# Patient Record
Sex: Male | Born: 1950 | Race: White | Hispanic: No | Marital: Married | Smoking: Former smoker
Health system: Southern US, Community
[De-identification: ages and names within clinical notes are randomized; demographics above are authoritative.]

## PROBLEM LIST (undated history)

## (undated) DIAGNOSIS — G459 Transient cerebral ischemic attack, unspecified: Secondary | ICD-10-CM

## (undated) DIAGNOSIS — E785 Hyperlipidemia, unspecified: Secondary | ICD-10-CM

## (undated) DIAGNOSIS — I10 Essential (primary) hypertension: Secondary | ICD-10-CM

## (undated) DIAGNOSIS — E291 Testicular hypofunction: Secondary | ICD-10-CM

## (undated) DIAGNOSIS — T7840XA Allergy, unspecified, initial encounter: Secondary | ICD-10-CM

## (undated) HISTORY — DX: Essential (primary) hypertension: I10

## (undated) HISTORY — DX: Hyperlipidemia, unspecified: E78.5

## (undated) HISTORY — DX: Transient cerebral ischemic attack, unspecified: G45.9

## (undated) HISTORY — DX: Testicular hypofunction: E29.1

## (undated) HISTORY — DX: Allergy, unspecified, initial encounter: T78.40XA

---

## 2004-12-28 ENCOUNTER — Ambulatory Visit: Payer: Self-pay | Admitting: Family Medicine

## 2005-01-04 ENCOUNTER — Ambulatory Visit: Payer: Self-pay | Admitting: Family Medicine

## 2005-01-12 ENCOUNTER — Ambulatory Visit: Payer: Self-pay | Admitting: Family Medicine

## 2005-01-30 ENCOUNTER — Ambulatory Visit: Payer: Self-pay | Admitting: Internal Medicine

## 2005-02-09 ENCOUNTER — Ambulatory Visit: Payer: Self-pay | Admitting: Internal Medicine

## 2005-02-13 ENCOUNTER — Ambulatory Visit: Payer: Self-pay | Admitting: Family Medicine

## 2005-03-14 ENCOUNTER — Ambulatory Visit: Payer: Self-pay | Admitting: Family Medicine

## 2005-05-29 ENCOUNTER — Ambulatory Visit: Payer: Self-pay | Admitting: Family Medicine

## 2005-08-30 ENCOUNTER — Ambulatory Visit: Payer: Self-pay | Admitting: Internal Medicine

## 2005-09-03 ENCOUNTER — Emergency Department (HOSPITAL_COMMUNITY): Admission: EM | Admit: 2005-09-03 | Discharge: 2005-09-03 | Payer: Self-pay | Admitting: Emergency Medicine

## 2005-09-03 ENCOUNTER — Emergency Department (HOSPITAL_COMMUNITY): Admission: EM | Admit: 2005-09-03 | Discharge: 2005-09-04 | Payer: Self-pay | Admitting: Emergency Medicine

## 2005-09-04 ENCOUNTER — Ambulatory Visit: Payer: Self-pay | Admitting: Family Medicine

## 2005-09-08 ENCOUNTER — Ambulatory Visit: Payer: Self-pay | Admitting: Family Medicine

## 2005-10-16 ENCOUNTER — Encounter: Payer: Self-pay | Admitting: Family Medicine

## 2005-10-16 HISTORY — PX: CHOLECYSTECTOMY: SHX55

## 2005-10-16 LAB — CONVERTED CEMR LAB

## 2005-10-24 ENCOUNTER — Ambulatory Visit: Payer: Self-pay | Admitting: Cardiology

## 2005-10-25 ENCOUNTER — Ambulatory Visit: Payer: Self-pay | Admitting: Internal Medicine

## 2005-10-25 ENCOUNTER — Emergency Department (HOSPITAL_COMMUNITY): Admission: EM | Admit: 2005-10-25 | Discharge: 2005-10-26 | Payer: Self-pay | Admitting: Emergency Medicine

## 2005-10-30 ENCOUNTER — Ambulatory Visit: Payer: Self-pay

## 2005-11-08 ENCOUNTER — Ambulatory Visit (HOSPITAL_COMMUNITY): Admission: RE | Admit: 2005-11-08 | Discharge: 2005-11-08 | Payer: Self-pay | Admitting: Family Medicine

## 2006-02-05 ENCOUNTER — Ambulatory Visit: Payer: Self-pay | Admitting: Family Medicine

## 2006-04-26 ENCOUNTER — Ambulatory Visit: Payer: Self-pay | Admitting: Family Medicine

## 2006-05-03 ENCOUNTER — Ambulatory Visit: Payer: Self-pay | Admitting: Family Medicine

## 2006-05-25 ENCOUNTER — Ambulatory Visit: Payer: Self-pay | Admitting: Internal Medicine

## 2006-05-29 ENCOUNTER — Encounter (INDEPENDENT_AMBULATORY_CARE_PROVIDER_SITE_OTHER): Payer: Self-pay | Admitting: *Deleted

## 2006-05-29 ENCOUNTER — Ambulatory Visit (HOSPITAL_COMMUNITY): Admission: RE | Admit: 2006-05-29 | Discharge: 2006-05-29 | Payer: Self-pay | Admitting: Internal Medicine

## 2006-05-31 ENCOUNTER — Ambulatory Visit: Payer: Self-pay | Admitting: Internal Medicine

## 2006-11-12 ENCOUNTER — Ambulatory Visit: Payer: Self-pay | Admitting: Family Medicine

## 2006-11-13 ENCOUNTER — Ambulatory Visit: Payer: Self-pay | Admitting: Internal Medicine

## 2006-11-13 ENCOUNTER — Inpatient Hospital Stay (HOSPITAL_COMMUNITY): Admission: AD | Admit: 2006-11-13 | Discharge: 2006-11-17 | Payer: Self-pay | Admitting: Internal Medicine

## 2006-11-13 ENCOUNTER — Emergency Department (HOSPITAL_COMMUNITY): Admission: EM | Admit: 2006-11-13 | Discharge: 2006-11-13 | Payer: Self-pay | Admitting: Emergency Medicine

## 2006-11-16 ENCOUNTER — Encounter (INDEPENDENT_AMBULATORY_CARE_PROVIDER_SITE_OTHER): Payer: Self-pay | Admitting: Specialist

## 2006-11-16 ENCOUNTER — Ambulatory Visit: Payer: Self-pay | Admitting: Internal Medicine

## 2007-06-21 ENCOUNTER — Encounter: Payer: Self-pay | Admitting: Family Medicine

## 2007-06-21 DIAGNOSIS — J309 Allergic rhinitis, unspecified: Secondary | ICD-10-CM

## 2007-06-21 DIAGNOSIS — L708 Other acne: Secondary | ICD-10-CM | POA: Insufficient documentation

## 2007-06-21 DIAGNOSIS — E781 Pure hyperglyceridemia: Secondary | ICD-10-CM

## 2007-06-26 ENCOUNTER — Ambulatory Visit: Payer: Self-pay | Admitting: Family Medicine

## 2007-06-26 DIAGNOSIS — F528 Other sexual dysfunction not due to a substance or known physiological condition: Secondary | ICD-10-CM | POA: Insufficient documentation

## 2007-08-20 ENCOUNTER — Ambulatory Visit: Payer: Self-pay | Admitting: Family Medicine

## 2008-08-25 ENCOUNTER — Ambulatory Visit: Payer: Self-pay | Admitting: Family Medicine

## 2008-09-22 ENCOUNTER — Ambulatory Visit: Payer: Self-pay | Admitting: Family Medicine

## 2008-09-22 LAB — CONVERTED CEMR LAB
ALT: 34 units/L (ref 0–53)
AST: 26 units/L (ref 0–37)
Albumin: 4.2 g/dL (ref 3.5–5.2)
Alkaline Phosphatase: 71 units/L (ref 39–117)
BUN: 14 mg/dL (ref 6–23)
Basophils Absolute: 0.1 10*3/uL (ref 0.0–0.1)
Basophils Relative: 1.2 % (ref 0.0–3.0)
Bilirubin Urine: NEGATIVE
Bilirubin, Direct: 0.1 mg/dL (ref 0.0–0.3)
Blood in Urine, dipstick: NEGATIVE
CO2: 30 meq/L (ref 19–32)
Calcium: 9.4 mg/dL (ref 8.4–10.5)
Chloride: 105 meq/L (ref 96–112)
Cholesterol: 138 mg/dL (ref 0–200)
Creatinine, Ser: 0.9 mg/dL (ref 0.4–1.5)
Eosinophils Absolute: 0.1 10*3/uL (ref 0.0–0.7)
Eosinophils Relative: 1.9 % (ref 0.0–5.0)
GFR calc Af Amer: 112 mL/min
GFR calc non Af Amer: 93 mL/min
Glucose, Bld: 98 mg/dL (ref 70–99)
Glucose, Urine, Semiquant: NEGATIVE
HCT: 43.9 % (ref 39.0–52.0)
HDL: 28.2 mg/dL — ABNORMAL LOW (ref >39.0)
Hemoglobin: 15.4 g/dL (ref 13.0–17.0)
Ketones, urine, test strip: NEGATIVE
LDL Cholesterol: 80 mg/dL (ref 0–99)
Lymphocytes Relative: 26.1 % (ref 12.0–46.0)
MCHC: 35 g/dL (ref 30.0–36.0)
MCV: 88.4 fL (ref 78.0–100.0)
Monocytes Absolute: 0.5 10*3/uL (ref 0.1–1.0)
Monocytes Relative: 11 % (ref 3.0–12.0)
Neutro Abs: 2.4 10*3/uL (ref 1.4–7.7)
Neutrophils Relative %: 59.8 % (ref 43.0–77.0)
Nitrite: NEGATIVE
PSA: 0.72 ng/mL (ref 0.10–4.00)
Platelets: 231 10*3/uL (ref 150–400)
Potassium: 4.9 meq/L (ref 3.5–5.1)
Protein, U semiquant: NEGATIVE
RBC: 4.97 M/uL (ref 4.22–5.81)
RDW: 12.6 % (ref 11.5–14.6)
Sodium: 141 meq/L (ref 135–145)
Specific Gravity, Urine: >=1.03
TSH: 2.93 microintl units/mL (ref 0.35–5.50)
Total Bilirubin: 0.9 mg/dL (ref 0.3–1.2)
Total CHOL/HDL Ratio: 4.9
Total Protein: 7.1 g/dL (ref 6.0–8.3)
Triglycerides: 151 mg/dL — ABNORMAL HIGH (ref 0–149)
Urobilinogen, UA: 0.2
VLDL: 30 mg/dL (ref 0–40)
WBC Urine, dipstick: NEGATIVE
WBC: 4.2 10*3/uL — ABNORMAL LOW (ref 4.5–10.5)
pH: 5.5

## 2008-09-30 ENCOUNTER — Ambulatory Visit: Payer: Self-pay | Admitting: Family Medicine

## 2009-06-11 DIAGNOSIS — H60339 Swimmer's ear, unspecified ear: Secondary | ICD-10-CM

## 2009-06-14 ENCOUNTER — Ambulatory Visit: Payer: Self-pay | Admitting: Family Medicine

## 2009-06-17 ENCOUNTER — Ambulatory Visit: Payer: Self-pay | Admitting: Family Medicine

## 2009-09-03 ENCOUNTER — Ambulatory Visit: Payer: Self-pay | Admitting: Family Medicine

## 2009-09-03 LAB — CONVERTED CEMR LAB
ALT: 32 units/L (ref 0–53)
AST: 22 units/L (ref 0–37)
Albumin: 4.3 g/dL (ref 3.5–5.2)
Alkaline Phosphatase: 69 units/L (ref 39–117)
Basophils Absolute: 0 10*3/uL (ref 0.0–0.1)
Basophils Relative: 0.6 % (ref 0.0–3.0)
Bilirubin, Direct: 0 mg/dL (ref 0.0–0.3)
Cholesterol: 152 mg/dL (ref 0–200)
Eosinophils Absolute: 0.1 10*3/uL (ref 0.0–0.7)
Eosinophils Relative: 1.9 % (ref 0.0–5.0)
HCT: 41.2 % (ref 39.0–52.0)
HDL: 32 mg/dL — ABNORMAL LOW (ref >39.00)
Hemoglobin: 14 g/dL (ref 13.0–17.0)
LDL Cholesterol: 83 mg/dL (ref 0–99)
Lymphocytes Relative: 23.1 % (ref 12.0–46.0)
Lymphs Abs: 0.9 10*3/uL (ref 0.7–4.0)
MCHC: 33.9 g/dL (ref 30.0–36.0)
MCV: 91.5 fL (ref 78.0–100.0)
Monocytes Absolute: 0.4 10*3/uL (ref 0.1–1.0)
Monocytes Relative: 10.9 % (ref 3.0–12.0)
Neutro Abs: 2.7 10*3/uL (ref 1.4–7.7)
Neutrophils Relative %: 63.5 % (ref 43.0–77.0)
PSA: 1.78 ng/mL (ref 0.10–4.00)
Platelets: 237 10*3/uL (ref 150.0–400.0)
RBC: 4.5 M/uL (ref 4.22–5.81)
RDW: 12.9 % (ref 11.5–14.6)
TSH: 3.28 microintl units/mL (ref 0.35–5.50)
Testosterone: 201.11 ng/dL — ABNORMAL LOW (ref 350.00–890.00)
Total Bilirubin: 0.6 mg/dL (ref 0.3–1.2)
Total CHOL/HDL Ratio: 5
Total Protein: 6.9 g/dL (ref 6.0–8.3)
Triglycerides: 184 mg/dL — ABNORMAL HIGH (ref 0.0–149.0)
VLDL: 36.8 mg/dL (ref 0.0–40.0)
WBC: 4.1 10*3/uL — ABNORMAL LOW (ref 4.5–10.5)

## 2009-10-01 ENCOUNTER — Ambulatory Visit: Payer: Self-pay | Admitting: Family Medicine

## 2009-10-01 DIAGNOSIS — L309 Dermatitis, unspecified: Secondary | ICD-10-CM | POA: Insufficient documentation

## 2009-10-01 DIAGNOSIS — R7989 Other specified abnormal findings of blood chemistry: Secondary | ICD-10-CM

## 2009-10-05 ENCOUNTER — Telehealth: Payer: Self-pay | Admitting: Family Medicine

## 2009-11-12 ENCOUNTER — Ambulatory Visit: Payer: Self-pay | Admitting: Family Medicine

## 2009-11-15 ENCOUNTER — Encounter: Payer: Self-pay | Admitting: Family Medicine

## 2009-11-15 ENCOUNTER — Telehealth: Payer: Self-pay | Admitting: Family Medicine

## 2009-11-15 LAB — CONVERTED CEMR LAB: Testosterone: 210.94 ng/dL — ABNORMAL LOW (ref 350.00–890.00)

## 2009-11-19 ENCOUNTER — Ambulatory Visit: Payer: Self-pay | Admitting: Family Medicine

## 2010-01-12 ENCOUNTER — Ambulatory Visit (HOSPITAL_COMMUNITY): Admission: RE | Admit: 2010-01-12 | Discharge: 2010-01-12 | Payer: Self-pay | Admitting: Surgery

## 2010-03-15 ENCOUNTER — Ambulatory Visit: Payer: Self-pay | Admitting: Internal Medicine

## 2010-03-15 DIAGNOSIS — H6500 Acute serous otitis media, unspecified ear: Secondary | ICD-10-CM

## 2010-03-17 ENCOUNTER — Ambulatory Visit: Payer: Self-pay | Admitting: Family Medicine

## 2010-03-17 DIAGNOSIS — H60399 Other infective otitis externa, unspecified ear: Secondary | ICD-10-CM | POA: Insufficient documentation

## 2010-11-13 LAB — CONVERTED CEMR LAB
ALT: 64 units/L — ABNORMAL HIGH (ref 0–53)
AST: 38 units/L — ABNORMAL HIGH (ref 0–37)
Albumin: 4.2 g/dL (ref 3.5–5.2)
Alkaline Phosphatase: 72 units/L (ref 39–117)
BUN: 14 mg/dL (ref 6–23)
Basophils Absolute: 0 10*3/uL (ref 0.0–0.1)
Basophils Relative: 0.4 % (ref 0.0–1.0)
Bilirubin, Direct: 0.2 mg/dL (ref 0.0–0.3)
CO2: 29 meq/L (ref 19–32)
Calcium: 9.4 mg/dL (ref 8.4–10.5)
Chloride: 105 meq/L (ref 96–112)
Cholesterol: 144 mg/dL (ref 0–200)
Creatinine, Ser: 0.9 mg/dL (ref 0.4–1.5)
Eosinophils Absolute: 0.1 10*3/uL (ref 0.0–0.6)
Eosinophils Relative: 1.6 % (ref 0.0–5.0)
GFR calc Af Amer: 113 mL/min
GFR calc non Af Amer: 93 mL/min
Glucose, Bld: 93 mg/dL (ref 70–99)
HCT: 42.4 % (ref 39.0–52.0)
HDL: 21.9 mg/dL — ABNORMAL LOW (ref >39.0)
Hemoglobin: 14.8 g/dL (ref 13.0–17.0)
LDL Cholesterol: 102 mg/dL — ABNORMAL HIGH (ref 0–99)
Lymphocytes Relative: 27 % (ref 12.0–46.0)
MCHC: 34.8 g/dL (ref 30.0–36.0)
MCV: 87.2 fL (ref 78.0–100.0)
Monocytes Absolute: 0.5 10*3/uL (ref 0.2–0.7)
Monocytes Relative: 11.1 % — ABNORMAL HIGH (ref 3.0–11.0)
Neutro Abs: 2.7 10*3/uL (ref 1.4–7.7)
Neutrophils Relative %: 59.9 % (ref 43.0–77.0)
PSA: 0.98 ng/mL (ref 0.10–4.00)
Platelets: 249 10*3/uL (ref 150–400)
Potassium: 4.6 meq/L (ref 3.5–5.1)
RBC: 4.86 M/uL (ref 4.22–5.81)
RDW: 12.3 % (ref 11.5–14.6)
Sodium: 141 meq/L (ref 135–145)
TSH: 1.38 microintl units/mL (ref 0.35–5.50)
Total Bilirubin: 1 mg/dL (ref 0.3–1.2)
Total CHOL/HDL Ratio: 6.6
Total Protein: 6.8 g/dL (ref 6.0–8.3)
Triglycerides: 102 mg/dL (ref 0–149)
VLDL: 20 mg/dL (ref 0–40)
WBC: 4.5 10*3/uL (ref 4.5–10.5)

## 2010-11-15 NOTE — Miscellaneous (Signed)
Summary: androderm rx  Clinical Lists Changes  Medications: Removed medication of ANDRODERM 2.5 MG/24HR PT24 (TESTOSTERONE) 1 patch daily Added new medication of ANDRODERM 5 MG/24HR PT24 (TESTOSTERONE) one patch daily - Signed Rx of ANDRODERM 5 MG/24HR PT24 (TESTOSTERONE) one patch daily;  #30 x 5;  Signed;  Entered by: Kern Reap CMA (AAMA);  Authorized by: Roderick Pee MD;  Method used: [facility] to [id number] [address], [place], [place], [place]  [address], Ph: [phone], Fax: [phone]    Prescriptions: ANDRODERM 5 MG/24HR PT24 (TESTOSTERONE) one patch daily  #30 x 5   Entered by:   Kern Reap CMA (AAMA)   Authorized by:   Roderick Pee MD   Signed by:   Kern Reap CMA (AAMA) on 11/15/2009   Method used:   Telephoned to ...       [facility] [id number] ([facility]ail)       [address]       [place], [place]  [address]       Ph: [phone]       Fax: [phone]   RxID:   [id number]

## 2010-11-15 NOTE — Assessment & Plan Note (Signed)
Summary: 1 month rov/njr   Vital Signs:  Patient profile:   60 year old male Weight:      191 pounds Temp:     98.8 degrees F oral BP sitting:   110 / 78  (left arm) Cuff size:   regular  Vitals Entered By: Kern Reap CMA Duncan Dull) (November 19, 2009 9:15 AM)  Reason for Visit follow up labs  History of Present Illness: Jerry [name] is a 60 year old male, who comes in today for evaluation of low testosterone.  His testosterone level and the fall was 201.  We started him on a supplement, now it's only risen to 210.  He's been compliant with his medication.  He is using the AndroGel patches  Allergies: No Known Drug Allergies  Review of Systems      See HPI  Physical Exam  General:  Well-developed,well-nourished,in no acute distress; alert,appropriate and cooperative throughout examination   Impression & Recommendations:  Problem # 1:  TESTICULAR HYPOFUNCTION (ICD-257.2) Assessment Unchanged  Orders: No Charge Patient Arrived (NCPA0) (NCPA0)  Complete Medication List: 1)  Zocor 10 Mg Tabs (Simvastatin) .... Take 1 tablet by mouth at bedtime 2)  Flonase 50 Mcg/act Susp (Fluticasone propionate) .... As needed 3)  Claritin 10 Mg Tabs (Loratadine) .... As needed 4)  Retin-a Micro 0.1 % Gel (Tretinoin microsphere) .... As needed 5)  Viagra 100 Mg Tabs (Sildenafil citrate) .... Uad 6)  Androderm 5 Mg/24hr Pt24 (Testosterone) .... One patch daily  Patient Instructions: 1)  call Dr. Viviann Spare dalsteadt at the urology Center for consult since the medication that we are giving you is not working

## 2010-11-15 NOTE — Progress Notes (Signed)
Summary: androderm concerns  Phone Note Call from Patient   Summary of Call: patient is calling because he feels as though all  the medication is "going through his skin".  he would like to know if there is anything else to try? any suggestions?  phone number [phone] work or [phone] cell. Initial call taken by: Kern Reap CMA Duncan Dull),  November 15, 2009 10:56 AM  Follow-up for Phone Call        unfortunately no Follow-up by: Roderick Pee MD,  November 15, 2009 1:34 PM  Additional Follow-up for Phone Call Additional follow up Details #1::        Phone Call Completed Additional Follow-up by: Kern Reap CMA Duncan Dull),  November 15, 2009 3:03 PM

## 2010-11-15 NOTE — Assessment & Plan Note (Signed)
Summary: ?ear inf/lft ear/cjr   Vital Signs:  Patient profile:   60 year old male Weight:      190 pounds BMI:     26.97 Temp:     98.2 degrees F oral BP sitting:   130 / 90  (left arm) Cuff size:   regular  Vitals Entered By: Kern Reap CMA Duncan Dull) (March 17, 2010 10:35 AM) CC: left ear pain   CC:  left ear pain.  History of Present Illness: Jerry [name] is a 60 year old male, who comes in today for reevaluation of a left otitis externa.  He was given new mycin otic drops on May 31st.  However, over the last two days.  The pain is gotten worse.  Allergies: No Known Drug Allergies  Social History: Reviewed history from 08/20/2007 and no changes required. 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 No apparent alopecia or balding. Eyes:  No corneal or conjunctival inflammation noted. EOMI. Perrla. Funduscopic exam benign, without hemorrhages, exudates or papilledema. Vision grossly normal. Ears:  right ear canal normal left ear canal swollen and red   Impression & Recommendations:  Problem # 1:  OTITIS EXTERNA (ICD-380.10) Assessment Deteriorated  The following medications were removed from the medication list:    Neomycin-polymyxin-hc 3.5-10000-1 Soln (Neomycin-polymyxin-hc) .Marland KitchenMarland KitchenMarland KitchenMarland Kitchen 4 drops to affected ear every 6 hours His updated medication list for this problem includes:    Cetraxal 0.2 % Soln (Ciprofloxacin hcl) .Marland Kitchen... 1 gtt l ear two times a day  Complete Medication List: 1)  Zocor 10 Mg Tabs (Simvastatin) .... Take 1 tablet by mouth at bedtime 2)  Flonase 50 Mcg/act Susp (Fluticasone propionate) .... As needed 3)  Claritin 10 Mg Tabs (Loratadine) .... As needed 4)  Retin-a  Micro 0.1 % Gel (Tretinoin microsphere) .... As needed 5)  Viagra 100 Mg Tabs (Sildenafil citrate) .... Uad 6)  Androgel Pump 1 % Gel (Testosterone) 7)  Cialis 10 Mg Tabs (Tadalafil) .... As directed 8)  Alavert Allergy/sinus 5-120 Mg Xr12h-tab (Loratadine-pseudoephedrine) .... One twice daily 9)  Cetraxal 0.2 % Soln (Ciprofloxacin hcl) .Marland Kitchen.. 1 gtt l ear two times a day 10)  Keflex 500 Mg Caps (Cephalexin) .... 2 by mouth two times a day  Patient Instructions: 1)  stopped the neomycin ear drops. 2)  Begin Cipro one drop left ear canal twice daily.  Also Keflex two tabs b.i.d. x 1 week Prescriptions: KEFLEX 500 MG CAPS (CEPHALEXIN) 2 by mouth two times a day  #40 x 1   Entered and Authorized by:   Roderick Pee MD   Signed by:   Roderick Pee MD on 03/17/2010   Method used:   [facility]        [facility] [address] [phone][facility] (retail)       [address]       [place], [place]  [address]       Ph: [phone]       Fax: [phone]   RxID:   [id number] CETRAXAL 0.2 % SOLN (CIPROFLOXACIN HCL) 1 gtt L ear two times a day  #10 cc x 2   Entered and Authorized by:   Roderick Pee MD   Signed by:   Eugenio Hoes  Todd MD on 03/17/2010   Method used:   [facility]ronically to        [facility]  [address] [id number] (retail)       [address]       [place], [place]  [address]       Ph: [phone]       Fax: [phone]   RxID:   [id number]

## 2010-11-15 NOTE — Assessment & Plan Note (Signed)
Summary: left ear issues//ccm   Vital Signs:  Patient profile:   60 year old male Weight:      193 pounds Temp:     98.1 degrees F oral BP sitting:   108 / 70  (right arm) Cuff size:   regular  Vitals Entered By: Duard Brady LPN (Mar 15, 2010 1:56 PM) CC: c/o (L) ear 'stopped up' Is Patient Diabetic? No   CC:  c/o (L) ear 'stopped up'.  History of Present Illness: 60 year old patient with a two-day history of discomfort and decreased auditory acuity on the left.  He does have a history of allergic rhinitis.  Denies any fever, sinus pain, purulent drainage, cough, or shortness of breath  Preventive Screening-Counseling & Management  Alcohol-Tobacco     Smoking Status: never  Allergies (verified): No Known Drug Allergies  Physical Exam  General:  Well-developed,well-nourished,in no acute distress; alert,appropriate and cooperative throughout examination Head:  Normocephalic and atraumatic without obvious abnormalities. No apparent alopecia or balding. Eyes:  No corneal or conjunctival inflammation noted. EOMI. Perrla. Funduscopic exam benign, without hemorrhages, exudates or papilledema. Vision grossly normal. Ears:  left TM was slightly erythematous and dull with diminished light reflex; decreased hearing on the left Weber did lateralize to the left Mouth:  Oral mucosa and oropharynx without lesions or exudates.  Teeth in good repair. Neck:  No deformities, masses, or tenderness noted. Lungs:  Normal respiratory effort, chest expands symmetrically. Lungs are clear to auscultation, no crackles or wheezes.   Impression & Recommendations:  Problem # 1:  ACUTE SEROUS OTITIS MEDIA (ICD-381.01)  Problem # 2:  ALLERGIC RHINITIS (ICD-477.9)  His updated medication list for this problem includes:    Flonase 50 Mcg/act Susp (Fluticasone propionate) .Marland Kitchen... As needed    Claritin 10 Mg Tabs (Loratadine) .Marland Kitchen... As needed  Complete Medication List: 1)  Zocor 10 Mg Tabs  (Simvastatin) .... Take 1 tablet by mouth at bedtime 2)  Flonase 50 Mcg/act Susp (Fluticasone propionate) .... As needed 3)  Claritin 10 Mg Tabs (Loratadine) .... As needed 4)  Retin-a Micro 0.1 % Gel (Tretinoin microsphere) .... As needed 5)  Viagra 100 Mg Tabs (Sildenafil citrate) .... Uad 6)  Androgel Pump 1 % Gel (Testosterone) 7)  Cialis 10 Mg Tabs (Tadalafil) .... As directed 8)  Alavert Allergy/sinus 5-120 Mg Xr12h-tab (Loratadine-pseudoephedrine) .... One twice daily 9)  Neomycin-polymyxin-hc 3.5-10000-1 Soln (Neomycin-polymyxin-hc) .... 4 drops to affected ear every 6 hours  Patient Instructions: 1)  Please schedule a follow-up appointment as needed. Prescriptions: NEOMYCIN-POLYMYXIN-HC 3.5-10000-1 SOLN (NEOMYCIN-POLYMYXIN-HC) 4 drops to affected ear every 6 hours  #20 cc x 0   Entered and Authorized by:   Gordy Savers  MD   Signed by:   Gordy Savers  MD on 03/15/2010   Method used:   [facility] to        [facility] [address] [phone][facility] (retail)       [address]       [place], [place]  [address]       Ph: [phone]       Fax: [phone]   RxID:   [id number] ALAVERT ALLERGY/SINUS 5-120 MG XR12H-TAB (LORATADINE-PSEUDOEPHEDRINE) one twice daily  #20 x 0   Entered and Authorized by:   Gordy Savers  MD   Signed by:   Gordy Savers  MD on 03/15/2010   Method used:   Electronically to  [facility] [id number] (retail)       [address]       [place], [place]  [address]       Ph: [phone]       Fax: [phone]   RxID:   [id number]

## 2010-12-15 ENCOUNTER — Encounter: Payer: Self-pay | Admitting: Family Medicine

## 2010-12-15 ENCOUNTER — Ambulatory Visit (INDEPENDENT_AMBULATORY_CARE_PROVIDER_SITE_OTHER): Payer: BC Managed Care – PPO | Admitting: Family Medicine

## 2010-12-15 VITALS — BP 120/80 | Temp 98.4°F | Ht 70.5 in | Wt 197.0 lb

## 2010-12-15 DIAGNOSIS — L738 Other specified follicular disorders: Secondary | ICD-10-CM

## 2010-12-15 DIAGNOSIS — L739 Follicular disorder, unspecified: Secondary | ICD-10-CM

## 2010-12-15 MED ORDER — SULFAMETHOXAZOLE-TRIMETHOPRIM 800-160 MG PO TABS: 1.0000 | ORAL_TABLET | Freq: Two times a day (BID) | ORAL | Status: AC

## 2010-12-15 MED ORDER — DOXYCYCLINE HYCLATE 100 MG PO TABS: 100.0000 mg | ORAL_TABLET | Freq: Two times a day (BID) | ORAL | Status: AC

## 2010-12-15 NOTE — Progress Notes (Signed)
  Subjective:    Patient ID: Jerry [name], male    DOB: January 05, 1951, 60 y.o.   MRN: [id number]  HPI  Jerry [name] is a 60 year old male, who comes in today for evaluation of some sore red lumps in his groin area x 2 months.  He, states he's had this problem off and on for the past couple months.  They get red, swollen, and tender, and then they seem to go away, but never completely resolved.  Review of Systems    Negative Objective:   Physical Exam Well-developed well-nourished, male in no acute distress.  Examination and genitalia penis normal.  The pubic hair above the shaft of the penis.  There are two pea-sized lesions.  The movable soft and tender, red, and sore       Assessment & Plan:  Folliculitis,,,,,,,,,,,, plan doxycycline b.i.d., Septra b.i.d. Follow-up in 3 weeks

## 2010-12-15 NOTE — Patient Instructions (Signed)
Take doxycycline and Septra, one of each twice daily.  Return in 3 weeks for follow-up

## 2011-01-09 LAB — DIFFERENTIAL
Basophils Absolute: 0 10*3/uL (ref 0.0–0.1)
Eosinophils Relative: 2 % (ref 0–5)
Lymphocytes Relative: 23 % (ref 12–46)
Lymphs Abs: 1 10*3/uL (ref 0.7–4.0)
Neutro Abs: 3 10*3/uL (ref 1.7–7.7)
Neutrophils Relative %: 67 % (ref 43–77)

## 2011-01-09 LAB — CBC
HCT: 42.8 % (ref 39.0–52.0)
Hemoglobin: 14.3 g/dL (ref 13.0–17.0)
MCHC: 33.4 g/dL (ref 30.0–36.0)
MCV: 89.2 fL (ref 78.0–100.0)
Platelets: 229 10*3/uL (ref 150–400)
RBC: 4.8 MIL/uL (ref 4.22–5.81)
RDW: 12.6 % (ref 11.5–15.5)
WBC: 4.4 10*3/uL (ref 4.0–10.5)

## 2011-01-09 LAB — BASIC METABOLIC PANEL
BUN: 13 mg/dL (ref 6–23)
CO2: 29 mEq/L (ref 19–32)
Calcium: 9.1 mg/dL (ref 8.4–10.5)
Chloride: 107 mEq/L (ref 96–112)
Creatinine, Ser: 1.02 mg/dL (ref 0.4–1.5)
GFR calc Af Amer: >60 mL/min (ref >60)
GFR calc non Af Amer: >60 mL/min (ref >60)
Glucose, Bld: 98 mg/dL (ref 70–99)
Potassium: 4.7 mEq/L (ref 3.5–5.1)
Sodium: 140 mEq/L (ref 135–145)

## 2011-01-12 ENCOUNTER — Encounter: Payer: Self-pay | Admitting: Family Medicine

## 2011-01-12 ENCOUNTER — Ambulatory Visit (INDEPENDENT_AMBULATORY_CARE_PROVIDER_SITE_OTHER): Payer: BC Managed Care – PPO | Admitting: Family Medicine

## 2011-01-12 DIAGNOSIS — L738 Other specified follicular disorders: Secondary | ICD-10-CM

## 2011-01-12 DIAGNOSIS — F528 Other sexual dysfunction not due to a substance or known physiological condition: Secondary | ICD-10-CM

## 2011-01-12 DIAGNOSIS — J301 Allergic rhinitis due to pollen: Secondary | ICD-10-CM

## 2011-01-12 LAB — LIPID PANEL
Cholesterol: 141 mg/dL (ref 0–200)
HDL: 28 mg/dL — ABNORMAL LOW (ref >39.00)
LDL Cholesterol: 77 mg/dL (ref 0–99)
Total CHOL/HDL Ratio: 5
Triglycerides: 181 mg/dL — ABNORMAL HIGH (ref 0.0–149.0)
VLDL: 36.2 mg/dL (ref 0.0–40.0)

## 2011-01-12 LAB — CBC WITH DIFFERENTIAL/PLATELET
Basophils Relative: 0.5 % (ref 0.0–3.0)
HCT: 48.7 % (ref 39.0–52.0)
Hemoglobin: 17 g/dL (ref 13.0–17.0)
Lymphocytes Relative: 22.8 % (ref 12.0–46.0)
Lymphs Abs: 1.3 10*3/uL (ref 0.7–4.0)
MCHC: 34.9 g/dL (ref 30.0–36.0)
Monocytes Relative: 9.5 % (ref 3.0–12.0)
Neutro Abs: 3.7 10*3/uL (ref 1.4–7.7)
RBC: 5.65 Mil/uL (ref 4.22–5.81)

## 2011-01-12 LAB — HEPATIC FUNCTION PANEL
ALT: 35 U/L (ref 0–53)
AST: 27 U/L (ref 0–37)
Albumin: 4.2 g/dL (ref 3.5–5.2)
Alkaline Phosphatase: 66 U/L (ref 39–117)
Bilirubin, Direct: 0.1 mg/dL (ref 0.0–0.3)
Total Bilirubin: 0.6 mg/dL (ref 0.3–1.2)
Total Protein: 6.8 g/dL (ref 6.0–8.3)

## 2011-01-12 LAB — TSH: TSH: 3.29 u[IU]/mL (ref 0.35–5.50)

## 2011-01-12 LAB — BASIC METABOLIC PANEL
BUN: 17 mg/dL (ref 6–23)
CO2: 28 mEq/L (ref 19–32)
Calcium: 9.1 mg/dL (ref 8.4–10.5)
Chloride: 103 mEq/L (ref 96–112)
Creatinine, Ser: 1.1 mg/dL (ref 0.4–1.5)
GFR: 76.17 mL/min (ref >60.00)
Glucose, Bld: 78 mg/dL (ref 70–99)
Potassium: 4.8 mEq/L (ref 3.5–5.1)
Sodium: 138 mEq/L (ref 135–145)

## 2011-01-12 LAB — TESTOSTERONE: Testosterone: 741.85 ng/dL (ref 350.00–890.00)

## 2011-01-12 LAB — POCT URINALYSIS DIPSTICK
Bilirubin, UA: NEGATIVE
Blood, UA: NEGATIVE
Glucose, UA: NEGATIVE
Ketones, UA: NEGATIVE
Leukocytes, UA: NEGATIVE
Nitrite, UA: NEGATIVE
Protein, UA: NEGATIVE
Spec Grav, UA: 1.025
Urobilinogen, UA: 0.2
pH, UA: 5

## 2011-01-12 LAB — PSA: PSA: 1.1 ng/mL (ref 0.10–4.00)

## 2011-01-12 MED ORDER — FLUTICASONE PROPIONATE 50 MCG/ACT NA SUSP: 2.0000 | Freq: Every day | NASAL | Status: DC

## 2011-01-12 NOTE — Patient Instructions (Signed)
we will do your lab work today.  Return sometime in the next two to 4 weeks for a 30 minute appointment for physical exam

## 2011-01-12 NOTE — Progress Notes (Signed)
  Subjective:    Patient ID: Jerry [name], male    DOB: 14-Nov-1950, 60 y.o.   MRN: [id number]  HPILarry is a 60 year old male, who comes in today for evaluation of folliculitis.  He had folliculitis in the groin was seen 3 weeks ago started on Keflex two tabs b.i.d. Lesions are completely healed.  He is due  for a physical exam    Review of Systems    General and dermatologic review of systems negative Objective:   Physical Exam    Well-developed well-nourished man in no acute distress.  Examination of the groin shows no pustular lesions    Assessment & Plan:  Folliculitis,,,,,,, finish Keflex.  Return for CPX

## 2011-01-16 NOTE — Progress Notes (Signed)
Spoke with patient's wife. Labs normal

## 2011-02-10 ENCOUNTER — Other Ambulatory Visit: Payer: Self-pay | Admitting: Family Medicine

## 2011-02-14 ENCOUNTER — Ambulatory Visit (INDEPENDENT_AMBULATORY_CARE_PROVIDER_SITE_OTHER): Payer: BC Managed Care – PPO | Admitting: Family Medicine

## 2011-02-14 ENCOUNTER — Encounter: Payer: Self-pay | Admitting: Family Medicine

## 2011-02-14 DIAGNOSIS — Z0000 Encounter for general adult medical examination without abnormal findings: Secondary | ICD-10-CM

## 2011-02-14 DIAGNOSIS — J309 Allergic rhinitis, unspecified: Secondary | ICD-10-CM

## 2011-02-14 DIAGNOSIS — L708 Other acne: Secondary | ICD-10-CM

## 2011-02-14 DIAGNOSIS — E781 Pure hyperglyceridemia: Secondary | ICD-10-CM

## 2011-02-14 DIAGNOSIS — F528 Other sexual dysfunction not due to a substance or known physiological condition: Secondary | ICD-10-CM

## 2011-02-14 DIAGNOSIS — J301 Allergic rhinitis due to pollen: Secondary | ICD-10-CM

## 2011-02-14 MED ORDER — FLUTICASONE PROPIONATE 50 MCG/ACT NA SUSP: 2.0000 | Freq: Every day | NASAL | Status: DC

## 2011-02-14 MED ORDER — SIMVASTATIN 10 MG PO TABS: 10.0000 mg | ORAL_TABLET | Freq: Every day | ORAL | Status: DC

## 2011-02-14 MED ORDER — TADALAFIL 10 MG PO TABS: 10.0000 mg | ORAL_TABLET | Freq: Every day | ORAL | Status: DC | PRN

## 2011-02-14 NOTE — Progress Notes (Signed)
  Subjective:    Patient ID: Jerry [name], male    DOB: 07-Apr-1951, 60 y.o.   MRN: [id number]  HPI  Jerry [name] is a 60 year old, divorced male, who comes in today for general physical examination because of a history of allergic rhinitis hyperlipidemia erectile dysfunction.  Low testosterone, chronic sun damage from tanning boost.  Four allergic rhinitis.  He takes steroid nasal spray and OTC Claritin.  For hyperlipidemia.  He takes Zocor 10 mg nightly, lipids are at goal.  For erectile dysfunction.  He uses Cialis 10 mg p.r.n.  He also has rosacea for which he uses Retin-A.0 1% gel nightly  He also is on testosterone supplement from his urologist because of low T.  He continues to get sun damage for not only the natural sunlight, but he has a tanning bed.  He was advised by his dermatologist to stop the sun exposure however, he continues to do this.  He was again advised by me that these two modalities will cause him to have skin cancer    Review of Systems  Constitutional: Negative.   HENT: Negative.   Eyes: Negative.   Respiratory: Negative.   Cardiovascular: Negative.   Gastrointestinal: Negative.   Genitourinary: Negative.   Musculoskeletal: Negative.   Skin: Negative.   Neurological: Negative.   Hematological: Negative.   Psychiatric/Behavioral: Negative.        Objective:   Physical Exam  Constitutional: He is oriented to person, place, and time. He appears well-developed and well-nourished.  HENT:  Head: Normocephalic and atraumatic.  Right Ear: External ear normal.  Left Ear: External ear normal.  Nose: Nose normal.  Mouth/Throat: Oropharynx is clear and moist.  Eyes: Conjunctivae and EOM are normal. Pupils are equal, round, and reactive to light.  Neck: Normal range of motion. Neck supple. No JVD present. No tracheal deviation present. No thyromegaly present.  Cardiovascular: Normal rate, regular rhythm, normal heart sounds and intact distal pulses.  Exam reveals  no gallop and no friction rub.   No murmur heard. Pulmonary/Chest: Effort normal and breath sounds normal. No stridor. No respiratory distress. He has no wheezes. He has no rales. He exhibits no tenderness.  Abdominal: Soft. Bowel sounds are normal. He exhibits no distension and no mass. There is no tenderness. There is no rebound and no guarding.  Genitourinary: Rectum normal, prostate normal and penis normal. Guaiac negative stool. No penile tenderness.  Musculoskeletal: Normal range of motion. He exhibits no edema and no tenderness.  Lymphadenopathy:    He has no cervical adenopathy.  Neurological: He is alert and oriented to person, place, and time. He has normal reflexes. No cranial nerve deficit. He exhibits normal muscle tone.  Skin: Skin is warm and dry. No rash noted. No erythema. No pallor.       Total body skin exam shows numerous freckles, nothing that looks abnormal.  He is total body can from the tanning booth exposure  Psychiatric: He has a normal mood and affect. His behavior is normal. Judgment and thought content normal.          Assessment & Plan:  Allergic rhinitis.  Continue Claritin and Flonase.  Hyperlipidemia.  Continue Zocor 10  Erectile dysfunction continue Cialis 10 mg p.r.n.  One station.  Continue Retin-A.  Low T. Continue testosterone supplement the urologist.  Chronic sun damage.  Stopped the tanning booth

## 2011-02-14 NOTE — Patient Instructions (Signed)
Stop the tanning booth !!!!!!!!!!!!!!!!!!!  Continue your regular medications.  Follow-up in one year or sooner if any problems

## 2011-03-03 NOTE — Assessment & Plan Note (Signed)
[facility]                           GASTROENTEROLOGY OFFICE NOTE   NAME:Jerry [name], Jerry [name]                       MRN:          [id number]  DATE:05/26/2006                            DOB:          12-23-1950    REFERRING PHYSICIAN:  Tinnie Gens A. Tawanna Cooler, MD   REASON FOR CONSULTATION:  Abdominal pain and bloating.   ASSESSMENT:  A 60 year old white man with negative colonoscopy in 2006  (screening) who has a 6 to 7 week history of epigastric pain and bloating,  which occurs after eating. This is a new problem. It may have started on a  milder basis 3 or 4 months ago. He has not lost any weight. He does not have  any nausea or vomiting, except 4 weeks ago, he had this. There are no other  constitutional symptoms related to this.   RECOMMENDATIONS/PLANS:  1. Upper GI endoscopy.  2. Further investigation could include CT scanning, depending upon what we      see. Obvious serious concerns are things like gastrointestinal neoplasm      or cancer of the stomach. There are no bowel habit changes to go along      with this and he had a normal colonoscopy in April of 2006. Is CBC is      normal except for just a slightly low white blood cell count.   I have explained the risks, benefits, and indications of upper GI endoscopy  and we currently plan to perform this on May 29, 2005 at 12:30 p.m. at  [facility].   HISTORY OF PRESENT ILLNESS:  As above, this 60 year old white man developed  these problems over time. There is no dysphagia. There is no heartburn. His  bowel habits are regular. See my medical history form for full details.   PAST MEDICAL HISTORY:  1. As above.  2. Seasonal allergies.  3. Dyslipidemia.   MEDICATIONS:  1. Zocor 10 mg daily.  2. Aspirin was on but discontinued.  3. Flonase p.r.n.  4. Claritin p.r.n.  5. Cephalexin 500 mg b.i.d., on a limited course currently for acne.   ALLERGIES:  NO KNOWN DRUG ALLERGIES.   FAMILY  HISTORY/SOCIAL HISTORY/REVIEW OF SYSTEMS:  Pertinent for that he is  married. 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 Social alcohol. No  drug use. See medical history form for further details.   PHYSICAL EXAMINATION:  GENERAL:  A well developed, well nourished white man.  Height 5 feet 11. Weight 188 pounds. Blood pressure 102/66. Pulse 72.  HEENT:  Eyes are anicteric. ENT is normal mouth, posterior pharynx.  NECK:  Supple. No thyromegaly or mass.  CHEST:  Clear.  HEART:  S1 and S2. No murmur, rub, or gallop.  ABDOMEN:  Soft, nontender. Without organomegaly or mass.  RECTAL:  Examination shows heme negative brown stool. No mass.  GENITOURINARY:  Prostate normal.  EXTREMITIES:  No edema.  NEUROLOGIC:  Alert and oriented times three.  SKIN:  No rash.   LABORATORY DATA:  CBC as described above with a slightly low white count but  otherwise normal  on April 26, 2006. His cholesterol was 155, triglycerides  120, HDL low at 24.8, LDL 106. His ALT is 5 points up at 45. His C-met is  normal otherwise. His PSA was 0.73. His TSH was 1.92.   I have also reviewed records kindly sent by Dr. Tawanna Cooler.   I appreciate the opportunity to care for this patient.                                   Iva Boop, MD, [facility]   CEG/MedQ  DD:  05/26/2006  DT:  05/26/2006  Job #:  [id number]

## 2011-03-03 NOTE — H&P (Signed)
Jerry [name], Jerry [name]                ACCOUNT NO.:  [id number]   MEDICAL RECORD NO.:  [id number]          PATIENT TYPE:  INP   LOCATION:  1829                         FACILITY:  [facility]   PHYSICIAN:  Georgina Quint. Plotnikov, M.D. Romualdo Bolk OF BIRTH:  [birth year]   DATE OF ADMISSION:  10/25/2005  DATE OF DISCHARGE:                                HISTORY & PHYSICAL   CHIEF COMPLAINT:  Chest pain.   HISTORY OF PRESENT ILLNESS:  The patient is a 60 year old male who was  driving home from work when he developed severe pain in the left side of his  chest around 6:20 p.m.  The pain was severe, 8 out of 10, went up his jaw  and down the left arm.  He was sweaty and felt like he may pass out.  He  pulled over and called 911.  They checked him out, gave him aspirin, and  took him to the hospital.  Pain stopped in less than 1 hour, at the time he  was arriving to the hospital, currently pain-free.   PAST MEDICAL HISTORY:  Elevated cholesterol.   CURRENT MEDICATIONS:  Zocor unknown dose daily.   FAMILY HISTORY:  Father died at age 68 with myocardial infarction.   SOCIAL HISTORY:  He is a Production designer, theatre/television/film.  He does not smoke.  He plays a lot of  golf.   ALLERGIES:  No known drug allergies.   REVIEW OF SYSTEMS:  No exertional chest pain.  No chest pain like this  previously.  No indigestion.  No neurologic complaints.  No bleeding.  The  rest is negative.   PHYSICAL EXAMINATION:  VITAL SIGNS:  Blood pressure 125/77, pulse 63,  afebrile, respirations 14, saturations 100% on room air.  GENERAL:  He is in no acute distress.  HEENT:  Moist mucosa.  NECK:  Supple.  No thyromegaly or bruit.  LUNGS:  Clear.  No wheezes or rales.  HEART:  S1 and S2.  No murmur, rub, or gallop.  Bradycardic.  ABDOMEN:  Soft, nontender, no organomegaly, and no mass felt.  EXTREMITIES:  Lower extremities without edema.  Calves nontender.  He is  alert, oriented, and cooperative.  Pulses normal on all four extremities.  Symmetric.   Muscles nontender.  Chest wall nontender.  SKIN:  Clear of rash or cyanosis.  NEUROLOGY:  He is alert, oriented, and cooperative.   LABORATORY DATA:  CBC normal.  INR 1.0, CK-MB 1.1, sodium 139, potassium  4.4, glucose 104, creatinine 0.9, myoglobin 79.1, troponin less than 0.05.   Chest x-ray without infiltrate.  EKG with sinus bradycardia and no acute  changes.   ASSESSMENT:  1.  Chest pain with radiation to jaw and left arm over 1 hour duration.      Initial enzymes/EKG are negative.  We will admit for subcu Lovenox and      telemetry, serial CK's, and troponins.  EKG in the morning.  2.  Family history of coronary artery disease.  Plan as above.  3.  Dyslipidemia on therapy.           ______________________________  Macarthur Critchley  Buckner Malta, M.D. [facility]     AVP/MEDQ  D:  10/25/2005  T:  10/26/2005  Job:  [id number]   cc:   Tinnie Gens A. Tawanna Cooler, M.D. [facility]  [address] [place]  [place] [address]

## 2011-03-03 NOTE — Discharge Summary (Signed)
NAMEMARVIN, [name] NO.:  [id number]   MEDICAL RECORD NO.:  [id number]          PATIENT TYPE:  EMS   LOCATION:  MAJO                         FACILITY:  [facility]   PHYSICIAN:  Jerry [name], M.D. LHCDATE OF BIRTH:  [birth year]   DATE OF ADMISSION:  10/25/2005  DATE OF DISCHARGE:  10/25/2005                                 DISCHARGE SUMMARY   DISCHARGE DIAGNOSES:  1.  Atypical chest pain.  2.  Dyslipidemia.   HISTORY OF PRESENT ILLNESS:  The patient is a 60 year old white male who was  admitted on October 25, 2005 after developing severe left arm, left jaw, and  chest pain which started at 5:20 p.m. on October 25, 2005.  The pain lasted  approximately one hour and resolved.  The patient was admitted for further  evaluation.   PAST MEDICAL HISTORY:  Dyslipidemia.   HOSPITAL COURSE:  Problem 1.  Atypical chest pain.  The patient was  admitted.  Initial EKG performed on admission showed sinus bradycardia.  Serial cardiac enzymes were performed which were negative x5.  Repeat 12-  lead EKG was performed on October 26, 2005 which also showed normal sinus  bradycardia.  The patient had no further recurrence of chest pain during  this admission.  His O2 saturations remained stable at 99% on room air.  A  chest x-ray showed questionable bronchitis and no pneumonia; however, the  patient denies any recent cough or sputum.  In addition, he denies any  history of GERD symptoms.  At this time, we plan to arrange outpatient  stress test secondary to the patient's history of dyslipidemia and positive  family history of coronary artery disease, as the patient's father died at  54 of an MI.  We will continue statin and add a baby aspirin once daily.  The patient is instructed to return to the emergency room should he develop  shortness of breath or chest pain.  Of note, a D-dimer was also performed  during this admission which was normal.   DISCHARGE MEDICATIONS:  1.   Zocor.  The patient is to resume the same dose as prior to admission.  2.  Aspirin 81 mg once daily.   DISCHARGE LABORATORY DATA:  BUN 17, creatinine 0.6.  Hemoglobin 13.7,  hematocrit 38.5.   FOLLOW UP:  The patient is instructed to follow up with Dr. Kelle [name] in  one to two weeks and call for an appointment.  We have also requested an  outpatient stress test to be arranged by cardiology prior to discharge.      Melissa S. Peggyann Juba, NP      Jerry [name], M.D. [facility]  Electronically Signed    MSO/MEDQ  D:  10/26/2005  T:  10/27/2005  Job:  [id number]   cc:   Tinnie Gens A. Tawanna Cooler, M.D. [facility]  [address] [place]  [place] [address]

## 2011-03-03 NOTE — H&P (Signed)
Jerry [name], BRILES                ACCOUNT NO.:  [id number]   MEDICAL RECORD NO.:  [id number]          PATIENT TYPE:  INP   LOCATION:  5715                         FACILITY:  [facility]   PHYSICIAN:  Iva Boop, MD,FACGDATE OF BIRTH:  1950-12-28   DATE OF ADMISSION:  11/13/2006  DATE OF DISCHARGE:                              HISTORY & PHYSICAL   CHIEF COMPLAINT:  Abdominal pain.   Mr. Keadle is a 60 year old white man that has had spells of recurrent  abdominal pain.  I know him from previous screening colonoscopy that was  normal (2006).  Subsequent to that, he had an upper endoscopy to  investigate epigastric pain that was postprandial in nature, on  05/29/2006.  He had some nonerosive gastritis in the thickened fold in  the gastric cartia, and those were negative for any significant  inflammation or biopsy, though he did have chronic gastritis.  I had not  seen him in a while, but apparently he has been having severe episodic  abdominal pain.  It has mainly been on the right side now, and he feels  bloated and swells after he eats.  The bloating may not be as bad as it  once was.  More recently, he has had trouble.  About a week or so ago,  he ate some food, and then later that night had problems.  Then again,  he was in the emergency department today after eating spaghetti last  night.  He went to bed and felt fine.  He awakened early in the morning  with rather intense right-sided pain in the mid and lower quadrant area  it seems.  He has had some nausea and vomiting.  There has been no  change in his bowel habits.  He has not had fever.  His laboratory  testing in the emergency room was really all normal including a CBC,  amylase, lipase, urinalysis, hepatic function panel and his ISTAT panel  with electrolytes, etc.  His glucose was slightly elevated at 121, but  he was not fasting.  He had a CT of the abdomen and pelvis that  demonstrated persistent haziness, which is mild,  in the mesenteric area  with shotty mesenteric lymph nodes and a 1.6 cm left periaortic low  density node.  That had been seen previously on CT scanning in November  2006, and then he had a CT without contrast looking for possible stones.  He had the enlarged persistent retroperitoneal lymph node, and because  of that a PET scan was done and was negative (11/08/2005).  He had not  really lost weight or anything, and there is no other chronic  constitutional symptomatology.   He was in the office today after being seen in the emergency department  because of intense pain, and he asked to be worked in sooner than the  appointment that was scheduled, so I saw him.  While there, he was in a  lot of pain, and he vomited.   His medications are Zocor 10 mg daily, and he has been given  prescriptions for Dilaudid and Reglan from  the emergency department.   DRUG ALLERGIES:  NONE KNOWN.   PAST MEDICAL HISTORY:  1. As described above.  2. Dyslipidemia.  3. Seasonal allergies.   FAMILY AND SOCIAL HISTORY:  He is married.  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  Social alcohol, no drug use.  There is no family  history of gastrointestinal illnesses.  His sister has had heart  disease.  Mother has diabetes.   REVIEW OF SYSTEMS:  He denies genitourinary complaints or back pain.  There has been no rash.  In between these spells he has felt well.  His  GI review of systems seemed negative otherwise, as do all other review  of systems.   PHYSICAL EXAM:  GENERAL:  Well-developed, well-nourished white man in  obvious pain.  He has a weight of 185 pounds, which is 3 pounds down  from August 2007.  VITAL SIGNS:  Blood pressure 102/64, pulse 68, respirations 18.  HEENT:  The eyes are anicteric.  Mouth:  Three lesions.  NECK:  Supple, no thyromegaly or mass.  CHEST:  Clear.  He has some mild right-sided CVA tenderness.  HEART:  S1, S2 no murmurs, rubs, or gallops.  ABDOMEN:  Soft, bowel  sounds present.  He is tender to tympany in the  right lower quadrant.  He does have moderate-to-severe pain in the right  mid and lower quadrants, and some in the right upper quadrant.  There is  no rebound tenderness.  His groin is nontender.  There is no hernia  detected.  EXTREMITIES:  Free of edema.  NEUROLOGIC:  He is alert and oriented x3.  LYMPHATIC:  There are no lymph nodes in the neck or supraclavicular area  palpated.   LAB DATA/X-RAY DATA:  As above. I have reviewed the ER notes from today.   ASSESSMENT:  Acute onset of right-sided abdominal pain really in the mid  and lower quadrants as well as the upper quadrants associated with  nausea and vomiting.  I suspect the pain is causing the nausea and  vomiting.  Etiology is unrevealing.  Note, he had increased pain with  flexion of his hip on the right, but not on the left.  When he tenses  abdominal muscles there is no increase in pain.  In fact, I think it is  better, so I do not think this is musculoskeletal.  The etiology is not  entirely clear, but even though his LFTs are normal and the CT looks  okay with respect to the gallbladder.  Certainly, a gallbladder attack  with stones or cholecystitis is in the differential.  Choledocholithiasis is as well, though his bile duct was not dilated on  CT scan.  I am uncertain as to the significance of the mesenteric  stranding.  There are some rare syndromes of sclerosing mesenteric  inflammation, I suspect he does not have that.  He has some shotty lymph  nodes.  My best guess is that it is from some previous inflammatory  disorder that he might have had.   PLAN:  1. Admit the patient for observation.  2. Pain control, hydrate, antiemetics.  3. Ultrasound of the abdomen tomorrow looking for possible gallstones.      If that is unrevealing a HIDA scan should be ordered.  Further     plans pending that.  A diagnostic laparoscopy could be indicated      depending upon the  overall course.      Maryjean Morn.  Leone Payor, MD,FACG  Electronically Signed     CEG/MEDQ  D:  11/13/2006  T:  11/14/2006  Job:  [id number]   cc:   Tinnie Gens A. Tawanna Cooler, MD

## 2011-03-03 NOTE — Assessment & Plan Note (Signed)
[facility]                                 ON-CALL NOTE   NAME:Jerry [name], Jerry [name]                         MRN:          [id number]  DATE:11/13/2006                            DOB:          [birth year]    Phone call comes from his wife Jerry [name] at [phone].  A patient of  Dr. Tawanna Cooler.  Phone call is at 2:47 a.m. on January 29.   Mr. Glasscock was seen yesterday for stomach pain by Dr. Tawanna Cooler.  Dr. Tawanna Cooler  told his wife to call [facility] if his pain got really bad again.  They  have not found anything with tests.  So that happened, she called 911  and she called now asking if Dr. Tawanna Cooler was on call and asking if we would  come to meet her at the hospital.   PLAN:  I told her that an emergency room doctor would evaluate him and  make a decision about whether admission was necessary and that someone  would see him there if that was the case.     Karie Schwalbe, MD  Electronically Signed    RIL/MedQ  DD: 11/13/2006  DT: 11/13/2006  Job #: [id number]   cc:   Tinnie Gens A. Tawanna Cooler, MD

## 2011-03-03 NOTE — Consult Note (Signed)
NAMEADYN, [name]                ACCOUNT NO.:  [id number]   MEDICAL RECORD NO.:  [id number]          PATIENT TYPE:  INP   LOCATION:  5715                         FACILITY:  [facility]   PHYSICIAN:  Leonie Man, M.D.   DATE OF BIRTH:  12-23-1950   DATE OF CONSULTATION:  11/14/2006  DATE OF DISCHARGE:                                 CONSULTATION   REASON FOR CONSULTATION:  Possible biliary colic.   HISTORY OF PRESENT ILLNESS:  Jerry [name] is a 60 year old male patient  who is mostly healthy.  For about one year he has been having some  atypical right sided abdominal pain, varies in location, usually starts  about the right middle quadrant with radiation up to the right upper  quadrant, intermittent in nature, not constant, and not everyday.  Initially, the patient was thought by his primary care physician to have  a volvulus and he was set up to see gastroenterology. Before he could be  seen, the patient was admitted to the hospital because of severe right  middle quadrant abdominal pain. CT scan of the abdomen was done and this  did not demonstrate a volvulus but did demonstrate fatty infiltration of  the liver, no diverticular disease.  His admitting lab work was  unremarkable.  His white count was normal. LFTs were normal.  Amylase  and lipase were normal.  The patient is now complaining more of right  upper quadrant abdominal pain. Ultrasound was ordered by  gastroenterology. This demonstrated 2 mm shadowing adherent to the  gallbladder wall consistent with either a stone or polyp. Otherwise,  ultrasound was normal.  Because of these findings and the patient's  clinical exam, surgical evaluation was requested.   REVIEW OF SYSTEMS:  As above.  Over the past few months, the patient has  noticed some postprandial symptoms involving discomfort in the right  upper quadrant region.  He has awakened several times at night with  chest and epigastric pain of several hours in duration.   Apparently he  has sought medical attention for this in the past.  He also reports that  when he went to see Dr. Leone Payor, at one point when he was examined, with  mashing on the right middle quadrant, it caused significant pain in the  right upper quadrant and subsequent nausea and vomiting.   PAST MEDICAL HISTORY:  1. Dyslipidemia.  2. Gastritis found on EGD in 2007.  3. Abnormal lymph nodes, periaortic, with a negative PET scan.   PAST SURGICAL HISTORY:  None.   ALLERGIES:  No known drug allergies.   MEDICATIONS:  Here include Klonopin, IV fluids, p.r.n. Dilaudid, and  p.r.n. Zofran.  At home, the patient takes Zocor for dyslipidemia.   PHYSICAL EXAMINATION:  GENERAL:  Pleasant male patient currently  complaining of right upper quadrant abdominal pain.  VITAL SIGNS:  Temperature 98.6, BP 98/57, pulse 69, respirations 18.  NEURO:  The patient alert and oriented x3. Moving all extremities x4.  No focal deficits.  HEENT:  Head normocephalic sclera not injected.  NECK:  Supple.  No appreciable adenopathy.  CHEST:  Bilateral lung sounds clear to auscultation, respiratory effort  nonlabored.  He is on room air.  CARDIAC:  S1-S2 without rubs, murmurs, thrills, or gallops.  Pulse is  regular.  Not tachycardic.  ABDOMEN:  Soft.  Bowel sounds are present.  No obvious  hepatosplenomegaly, masses or bruits.  He is tender in the right upper  quadrant consistent with a positive Murphy's sign but no guarding or  rebounding.  EXTREMITIES:  Symmetrical in appearance without edema, cyanosis or  clubbing.   LABORATORY DATA:  Lipase 30, amylase 51, white count 5900, neutrophils  66%, hemoglobin 13, platelets 245,000.  Sodium 143, potassium 4.3, CO2  30, BUN 12, creatinine 0.92.  LFTs are normal.  Diagnostics ultrasound  of the abdomen and pelvis is noted with questionable stone versus polyps  adherent to the gallbladder wall.  CT demonstrating fatty liver  infiltration of the liver, old but  stable left periaortic lymph nodes,  and mesenteric lymph nodes.   IMPRESSION:  Right upper quadrant abdominal pain, rule out biliary colic  secondary to biliary dyskinesia.   PLAN:  HIDA scan, unable to do today, will perform tomorrow.  Will also  check ejection fraction. If findings are positive, the patient may  benefit from laparoscopic cholecystectomy.  Since, otherwise, the  patient is stable, no white count elevation, no transaminitis, no  evidence of obstruction currently, if can tolerate a low fat diet, could  conceivably perform cholecystectomy on an elective basis.  I will defer  this to Dr. Lurene Shadow.      Allison L. Rennis Harding, N.P.      Leonie Man, M.D.  Electronically Signed    ALE/MEDQ  D:  11/14/2006  T:  11/14/2006  Job:  [id number]   cc:   Tinnie Gens A. Tawanna Cooler, MD  Iva Boop, MD,FACG

## 2011-03-03 NOTE — Op Note (Signed)
NAMEBENAIAH, BEHAN                ACCOUNT NO.:  [id number]   MEDICAL RECORD NO.:  [id number]          PATIENT TYPE:  INP   LOCATION:  5715                         FACILITY:  [facility]   PHYSICIAN:  Leonie Man, M.D.   DATE OF BIRTH:  [birth year]   DATE OF PROCEDURE:  11/16/2006  DATE OF DISCHARGE:                               OPERATIVE REPORT   PREOPERATIVE DIAGNOSIS:  Biliary dyskinesia.   POSTOPERATIVE DIAGNOSIS:  Biliary dyskinesia.   PROCEDURE:  Laparoscopic cholecystectomy.   SURGEON:  Leonie Man, M.D.   ASSISTANT:  Ollen Gross. Vernell Morgans, M.D.   ANESTHESIA:  General.   INDICATIONS:  Mr. Ladavion Savitz is a 60 year old gentleman with recurrent  episodes of right-sided abdominal pain exacerbated by food.  Ultrasound  of his gallbladder shows polyps within the gallbladder.  There is no  gallbladder-wall thickening; however, on nuclear medicine HIDA scan, he  does have a decreased ejection fraction at 26%.  The patient comes to  the operating room after the risks and potential benefits of surgery had  been discussed, all questions answered and consent obtained.   DESCRIPTION OF PROCEDURE:  Following the induction of satisfactory  general anesthesia, the patient was positioned supinely.  The abdomen  was prepped and draped to be included in a sterile operative field.  Open laparoscopy was created at the umbilicus with insertion of a Hasson  cannula and insufflation of the peritoneal cavity to 15 mmHg of pressure  using carbon dioxide.  The camera was inserted and visual exploration  showed the gallbladder to be modestly scarred.  There were no adhesions  of the gallbladder wall.  The liver edges were sharp, the liver surfaces  smooth.  No small or large intestine abnormality was noted.  Under  direct vision, epigastric and flank ports were placed.  The gallbladder  was grasped and retracted cephalad, and dissection carried down to the  hepatoduodenal ligament.  This isolated  the cystic artery and the cystic  duct.  The cystic duct was very small and fibrotic.  I did attempt to do  a cholangiogram; however, the cystic duct was so small, the  cholangiocatheter could not enter its lumen.  The cystic duct was then  doubly clipped and transected.  The cystic artery was isolated and  traced up to the gallbladder wall.  This was doubly clipped and  transected.  The gallbladder was then dissected free from the liver bed  using electrocautery, and maintaining hemostasis throughout the course  of the dissection.  At the end of the dissection, additional bleeding  points in the liver bed were treated with electrocautery.  The right  upper quadrant was then thoroughly irrigated with multiple aliquots of  normal saline, and the gallbladder was placed in a laparoscopic  retrieval pouch and retrieved through the umbilical wound without  difficulty.  Sponges, instruments and sharp counts were verified.  The  trocars were removed under direct vision.  Pneumoperitoneum was allowed  to deflate, and the wounds were closed in layers as follows.  The  umbilical wound was closed with 0 Vicryl and 4-0  Monocryl.  The epigastric and flank wounds were closed with 4-0  Monocryl.  All wounds were reinforced with Steri-Strips.  Sterile  dressings were applied.  The anesthetic was reversed and the patient  removed from the operating room to the recovery room in stable  condition.  He tolerated the procedure well.      Leonie Man, M.D.  Electronically Signed     PB/MEDQ  D:  11/16/2006  T:  11/16/2006  Job:  [id number]   cc:   Iva Boop, MD,FACG

## 2011-03-03 NOTE — Discharge Summary (Signed)
NAMEREGINA, COPPOLINO NO.:  [id number]   MEDICAL RECORD NO.:  [id number]          PATIENT TYPE:  INP   LOCATION:  5715                         FACILITY:  [facility]   PHYSICIAN:  Dorisann Frames, M.D.   DATE OF BIRTH:  09-Jul-1951   DATE OF ADMISSION:  11/13/2006  DATE OF DISCHARGE:  11/17/2006                               DISCHARGE SUMMARY   DISCHARGING PHYSICIAN:  Leonie Man, M.D.   CHIEF COMPLAINT/REASON FOR ADMISSION:  Mr. Donson is a 60 year old male  patient; well known to Dr. Leone Payor from prior gastroenterology  procedures including colonoscopy.  He has had a prior nonerosive  gastritis with no malignancy seen.  He has been experiencing severe  episodic abdominal pain on the right side feeling bloated, and swollen  after eating, worse over the past week; and had  nocturnal symptoms over  the past week after eating.  He presented to the emergency department  early in the morning on the day of admission, after experiencing pain  after eating spaghetti.  This pain awakened him from sleep.  In the ER  all lab work was normal.  CT of the abdomen and pelvis was done where  she was sewed no significant new findings.  There was some mild  persistent haziness in the mesenteric area with shoddy mesenteric lymph  nodes;  and a 1.6 cm left periaortic low-density node.  This is known  from prior CT scan in November 2006.  CT was done without contrast to  look for possible stones.  None of this was seen.  On exam his abdomen  was soft with bowel sounds present.  He was tender to percussion in the  right lower quadrant.  There was moderate-to-severe pain in the right  mid and lower quadrants and some in the right upper quadrant without  rebound tenderness.  The patient was admitted with acute abdominal pain  of uncertain etiology with consideration for biliary disease/biliary  colic as etiology.   HOSPITAL COURSE:  The patient was admitted by Dr. Leone Payor to the general  floor with plans to observe the patient, hydrate him,, treat symptoms,  pain control, and obtain an ultrasound of the abdomen, and if the  ultrasound of the abdomen was nonrevealing proceed with a HIDA scan.   HOSPITAL COURSE:  The patient did undergo an ultrasound after admission  which showed a 2-mm shadow on the gallbladder wall, stone versus polyp,  so a surgical evaluation was requested and Dr. Talmage Nap with [facility] did evaluate the patient for possibility that the  patient was having right upper quadrant abdominal pain secondary to  biliary colic versus biliary dyskinesia.  It was decided, at that time,  to go ahead and proceed with the HIDA scan to elucidate the etiology of  the patient's pain.   The HIDA scan results back on November 15, 2006.  This did demonstrate a  low ejection fraction of 26%; and in addition, the ultrasound had shown  a polyp versus and adhered stone.  It was felt , at this point, that the  patient's right upper  quadrant pain and nausea were of biliary etiology;  and that he would probably would benefit from laparoscopic  cholecystectomy.  He remained, otherwise, stable without any worsening  of his right upper quadrant pain; and no fevers and no leukocytosis.  The risks and benefits of the procedure were discussed with the patient;  and he opted to proceed with Dr. Talmage Nap as operative physician.   On November 16, 2006 the patient underwent laparoscopic cholecystectomy  by Dr. Talmage Nap for pre-and-postoperative diagnosis of biliary dyskinesia.  On postop day #1 the patient was asymptomatic.  He was tolerating  Vicodin and diet; and was deemed appropriate for discharge home.   FINAL DISCHARGE DIAGNOSES:  1. Postprandial and nocturnal abdominal pain, secondary to biliary      dyskinesia.  2. Status post laparoscopic cholecystectomy by Dr. Talmage Nap.   DISCHARGE MEDICATIONS:  Please resume any medication you were taking  prior to admission.  Vicodin  as needed for pain.   ADDITIONAL INSTRUCTIONS:  Please refer to the home care instructions for  laparoscopic cholecystectomy provided by [facility].   FOLLOWUP:  You will need to see Dr. Talmage Nap in 2 weeks; and you need to  call to schedule that appointment.      Allison L. Rennis Harding, N.P.    ______________________________  Dorisann Frames, M.D.    ALE/MEDQ  D:  03/05/2007  T:  03/05/2007  Job:  [id number]

## 2011-08-18 ENCOUNTER — Encounter: Payer: Self-pay | Admitting: Internal Medicine

## 2011-08-18 ENCOUNTER — Ambulatory Visit (INDEPENDENT_AMBULATORY_CARE_PROVIDER_SITE_OTHER): Payer: BC Managed Care – PPO | Admitting: Internal Medicine

## 2011-08-18 DIAGNOSIS — J209 Acute bronchitis, unspecified: Secondary | ICD-10-CM

## 2011-08-18 MED ORDER — AZITHROMYCIN 250 MG PO TABS: ORAL_TABLET | ORAL | Status: DC

## 2011-08-18 MED ORDER — HYDROCODONE-HOMATROPINE 5-1.5 MG/5ML PO SYRP: 5.0000 mL | ORAL_SOLUTION | Freq: Three times a day (TID) | ORAL | Status: AC | PRN

## 2011-08-18 NOTE — Progress Notes (Signed)
  Subjective:    Patient ID: Jerry [name], male    DOB: 09-10-1951, 60 y.o.   MRN: [id number]  URI  This is a new problem. The current episode started in the past 7 days. The problem has been gradually worsening. There has been no fever. Associated symptoms include congestion, coughing and a sore throat. Pertinent negatives include no diarrhea or nausea.   Patient also complains of feeling achy all over.  Sick contacts at work.  Review of Systems  HENT: Positive for congestion and sore throat.   Respiratory: Positive for cough.   Gastrointestinal: Negative for nausea and diarrhea.   No past medical history on file.  History   Social History  . Marital Status: Married    Spouse Name: N/A    Number of Children: N/A  . Years of Education: N/A   Occupational History  . Not on file.   Social History Main Topics  . Smoking status: Never Smoker   . Smokeless tobacco: Not on file  . Alcohol Use: Yes  . Drug Use: No  . Sexually Active:    Other Topics Concern  . Not on file   Social History Narrative  . No narrative on file    No past surgical history on file.  No family history on file.  Not on File  Current Outpatient Prescriptions on File Prior to Visit  Medication Sig Dispense Refill  . AXIRON 30 MG/ACT SOLN Place 1 application onto the skin 1 dose over 46 hours.       . fluticasone (FLONASE) 50 MCG/ACT nasal spray 2 sprays by Nasal route daily.  16 g  11  . loratadine (CLARITIN) 10 MG tablet Take 10 mg by mouth daily.        . simvastatin (ZOCOR) 10 MG tablet Take 1 tablet (10 mg total) by mouth at bedtime.  100 tablet  3  . tadalafil (CIALIS) 10 MG tablet Take 1 tablet (10 mg total) by mouth daily as needed.  10 tablet  11    Height 5'11", weight 191 pounds, temp 98.6, pressure 122/84      Objective:   Physical Exam   Constitutional: Appears well-developed and well-nourished. No distress.  Head: Normocephalic and atraumatic.  Ear:  Left tympanic  membrane is slightly erythematous.  Hearing is grossly normal Mouth/Throat: Mild oropharyngeal erythema, no exudates  Eyes: Conjunctivae are normal. Pupils are equal, round, and reactive to light.  Neck: Nontender, no adenopathy Cardiovascular: Normal rate, regular rhythm and normal heart sounds.  Exam reveals no gallop and no friction rub.  No murmur heard. Pulmonary/Chest: Effort normal and coarse breath sounds bilaterally  Abdominal: Soft. Bowel sounds are normal. No mass. There is no tenderness.  Neurological: Alert. No cranial nerve deficit.  Skin: Skin is warm and dry.  Psychiatric: Normal mood and affect. Behavior is normal.      Assessment & Plan:

## 2011-08-18 NOTE — Patient Instructions (Signed)
Please call our office if your symptoms do not improve or gets worse.  

## 2011-08-18 NOTE — Assessment & Plan Note (Signed)
60 year old with early acute bronchitis.  Patient advised to take azithromycin 500 mg daily x3 days.  Use hycodan for cough.  Patient advised to call office if symptoms persist or worsen.

## 2011-08-24 ENCOUNTER — Encounter: Payer: Self-pay | Admitting: Family Medicine

## 2011-08-24 ENCOUNTER — Ambulatory Visit (INDEPENDENT_AMBULATORY_CARE_PROVIDER_SITE_OTHER): Payer: BC Managed Care – PPO | Admitting: Family Medicine

## 2011-08-24 VITALS — BP 100/60 | Temp 98.6°F | Wt 192.0 lb

## 2011-08-24 DIAGNOSIS — J45909 Unspecified asthma, uncomplicated: Secondary | ICD-10-CM

## 2011-08-24 MED ORDER — PREDNISONE 20 MG PO TABS
ORAL_TABLET | ORAL | Status: DC
Start: 2011-08-24 — End: 2012-01-25

## 2011-08-24 NOTE — Progress Notes (Signed)
  Subjective:    Patient ID: Jerry [name], male    DOB: 04-Dec-1950, 60 y.o.   MRN: [id number]  HPI  Jerry [name] is a 60 year old male, nonsmoker, who comes in today with a two week history of coughing.  Two weeks ago he developed a viral syndrome with secondary cough.  That was persistent.  He was seen here and given a Z-Pak, which hasn't helped.  He has no fever no sputum production.  No history of asthma.   Review of Systems General and pulmonary review of systems otherwise negative    Objective:   Physical Exam Well-developed well-nourished man in no acute distress with expiratory rate 12 and unlabored.  HEENT negative.  Neck was supple.  No adenopathy.  Lungs were clear       Assessment & Plan:  Reactive airway disease.  Plan prednisone burst and taper return p.r.n.

## 2011-08-24 NOTE — Patient Instructions (Signed)
Take the prednisone as directed.  Return p.r.n. 

## 2011-08-28 ENCOUNTER — Encounter: Payer: Self-pay | Admitting: Family Medicine

## 2011-08-28 ENCOUNTER — Ambulatory Visit (INDEPENDENT_AMBULATORY_CARE_PROVIDER_SITE_OTHER): Payer: BC Managed Care – PPO | Admitting: Family Medicine

## 2011-08-28 DIAGNOSIS — L820 Inflamed seborrheic keratosis: Secondary | ICD-10-CM

## 2011-08-28 DIAGNOSIS — L989 Disorder of the skin and subcutaneous tissue, unspecified: Secondary | ICD-10-CM

## 2011-08-28 NOTE — Progress Notes (Signed)
  Subjective:    Patient ID: Jerry [name], male    DOB: February 08, 1951, 60 y.o.   MRN: [id number]  HPI Jerry [name] is a 60 year old male, who comes in today for removal of a lesion in his scalp.  After informed consent, the lesion was cleaned with alcohol and anesthetized with 1% Xylocaine with epinephrine.  It was excised with 3-mm margins and sent for pathologic analysis.  The base was cauterized.  He tolerated the procedure.  No complications.   Review of Systems    General and dermatologic review of systems otherwise negative Objective:   Physical Exam  Procedure see above      Assessment & Plan:  Lesion removed, clinically appears to be an irritated seborrheic keratosis, path pending

## 2011-08-28 NOTE — Patient Instructions (Signed)
We will call you within two weeks with the path report if we do not call you you call us.

## 2011-09-04 ENCOUNTER — Telehealth: Payer: Self-pay | Admitting: Family Medicine

## 2011-09-04 DIAGNOSIS — E781 Pure hyperglyceridemia: Secondary | ICD-10-CM

## 2011-09-04 MED ORDER — SIMVASTATIN 10 MG PO TABS: 10.0000 mg | ORAL_TABLET | Freq: Every day | ORAL | Status: DC

## 2011-09-04 NOTE — Telephone Encounter (Signed)
Needs new rx for Simvastatin at [facility]. Thanks.  (new Pharmacy)

## 2011-10-31 ENCOUNTER — Other Ambulatory Visit: Payer: Self-pay | Admitting: *Deleted

## 2011-10-31 DIAGNOSIS — F528 Other sexual dysfunction not due to a substance or known physiological condition: Secondary | ICD-10-CM

## 2011-10-31 MED ORDER — TADALAFIL 10 MG PO TABS: 10.0000 mg | ORAL_TABLET | Freq: Every day | ORAL | Status: DC | PRN

## 2012-01-25 ENCOUNTER — Encounter: Payer: Self-pay | Admitting: Family Medicine

## 2012-01-25 ENCOUNTER — Ambulatory Visit (INDEPENDENT_AMBULATORY_CARE_PROVIDER_SITE_OTHER): Payer: BC Managed Care – PPO | Admitting: Family Medicine

## 2012-01-25 VITALS — BP 120/80 | Temp 98.2°F | Wt 188.0 lb

## 2012-01-25 DIAGNOSIS — E781 Pure hyperglyceridemia: Secondary | ICD-10-CM

## 2012-01-25 DIAGNOSIS — F528 Other sexual dysfunction not due to a substance or known physiological condition: Secondary | ICD-10-CM

## 2012-01-25 MED ORDER — TADALAFIL 10 MG PO TABS: 10.0000 mg | ORAL_TABLET | Freq: Every day | ORAL | Status: DC | PRN

## 2012-01-25 MED ORDER — SIMVASTATIN 10 MG PO TABS: 10.0000 mg | ORAL_TABLET | Freq: Every day | ORAL | Status: DC

## 2012-01-25 NOTE — Patient Instructions (Signed)
Continue to hold the Accutane  Return in June for a physical examination labs one week prior nonfasting

## 2012-01-25 NOTE — Progress Notes (Signed)
  Subjective:    Patient ID: Jerry [name], male    DOB: 09-11-1951, 61 y.o.   MRN: [id number]  HPI Jerry [name] is a 61 year old male who comes in today for evaluation of elevated lipids  He has been on Zocor 10 mg daily. Lipids are normal. He's been on medication from his dermatologist for acne and over the last couple months his triglycerides been elevated. There are now to the 464 range and he stopped his Accutane 3 days ago asymptomatic   Review of Systems General and dermatologic review of systems otherwise negative    Objective:   Physical Exam  Well-developed well-nourished male in no acute distress      Assessment & Plan:

## 2012-04-09 ENCOUNTER — Other Ambulatory Visit (INDEPENDENT_AMBULATORY_CARE_PROVIDER_SITE_OTHER): Payer: BC Managed Care – PPO

## 2012-04-09 DIAGNOSIS — E781 Pure hyperglyceridemia: Secondary | ICD-10-CM

## 2012-04-09 DIAGNOSIS — Z0000 Encounter for general adult medical examination without abnormal findings: Secondary | ICD-10-CM

## 2012-04-09 DIAGNOSIS — F528 Other sexual dysfunction not due to a substance or known physiological condition: Secondary | ICD-10-CM

## 2012-04-09 LAB — CBC WITH DIFFERENTIAL/PLATELET
Basophils Relative: 0.7 % (ref 0.0–3.0)
Eosinophils Relative: 2 % (ref 0.0–5.0)
Lymphocytes Relative: 19.7 % (ref 12.0–46.0)
MCV: 89 fl (ref 78.0–100.0)
Monocytes Absolute: 0.6 10*3/uL (ref 0.1–1.0)
Neutrophils Relative %: 65.9 % (ref 43.0–77.0)
RBC: 5.23 Mil/uL (ref 4.22–5.81)
WBC: 5.5 10*3/uL (ref 4.5–10.5)

## 2012-04-09 LAB — POCT URINALYSIS DIPSTICK
Bilirubin, UA: NEGATIVE
Glucose, UA: NEGATIVE
Leukocytes, UA: NEGATIVE
Nitrite, UA: NEGATIVE
pH, UA: 5.5

## 2012-04-09 LAB — BASIC METABOLIC PANEL
Chloride: 101 mEq/L (ref 96–112)
Creatinine, Ser: 1 mg/dL (ref 0.4–1.5)

## 2012-04-09 LAB — HEPATIC FUNCTION PANEL
ALT: 32 U/L (ref 0–53)
Alkaline Phosphatase: 70 U/L (ref 39–117)
Bilirubin, Direct: 0.1 mg/dL (ref 0.0–0.3)
Total Protein: 6.9 g/dL (ref 6.0–8.3)

## 2012-04-09 LAB — LIPID PANEL
Cholesterol: 147 mg/dL (ref 0–200)
LDL Cholesterol: 80 mg/dL (ref 0–99)

## 2012-04-09 LAB — TSH: TSH: 2.36 u[IU]/mL (ref 0.35–5.50)

## 2012-04-23 ENCOUNTER — Encounter: Payer: Self-pay | Admitting: Family Medicine

## 2012-04-23 ENCOUNTER — Ambulatory Visit (INDEPENDENT_AMBULATORY_CARE_PROVIDER_SITE_OTHER): Payer: BC Managed Care – PPO | Admitting: Family Medicine

## 2012-04-23 DIAGNOSIS — J301 Allergic rhinitis due to pollen: Secondary | ICD-10-CM

## 2012-04-23 DIAGNOSIS — F528 Other sexual dysfunction not due to a substance or known physiological condition: Secondary | ICD-10-CM

## 2012-04-23 DIAGNOSIS — L578 Other skin changes due to chronic exposure to nonionizing radiation: Secondary | ICD-10-CM

## 2012-04-23 DIAGNOSIS — E781 Pure hyperglyceridemia: Secondary | ICD-10-CM

## 2012-04-23 DIAGNOSIS — E291 Testicular hypofunction: Secondary | ICD-10-CM

## 2012-04-23 DIAGNOSIS — J309 Allergic rhinitis, unspecified: Secondary | ICD-10-CM

## 2012-04-23 MED ORDER — SIMVASTATIN 10 MG PO TABS: 10.0000 mg | ORAL_TABLET | Freq: Every day | ORAL | Status: DC

## 2012-04-23 MED ORDER — TADALAFIL 10 MG PO TABS: 10.0000 mg | ORAL_TABLET | Freq: Every day | ORAL | Status: DC | PRN

## 2012-04-23 MED ORDER — FLUTICASONE PROPIONATE 50 MCG/ACT NA SUSP: 2.0000 | Freq: Every day | NASAL | Status: DC

## 2012-04-23 NOTE — Patient Instructions (Signed)
Continue your current medications  Remember to wear sunscreens SPF 50+ a hat to protect u  face  Return in 6 months for a skin exam  Followup with urology as outlined

## 2012-04-23 NOTE — Progress Notes (Signed)
  Subjective:    Patient ID: Jerry [name], male    DOB: 11-22-1950, 61 y.o.   MRN: [id number]  HPI Calix is a 61 year old single male nonsmoker who comes in for physical examination because of a history of low testosterone, hyperlipidemia, allergic rhinitis, and solar dermatitis from chronic sun exposure  His medicines reviewed the been no changes. We had tried a couple medications for the recti dysfunction and hypo-gonad is some however nothing seemed to help. He now is seeing her urologist who has him on axiron 30 mg daily.  He had a recent eye exam which was normal, regular dental care, colonoscopy and GI, tetanus up-to-date   Review of Systems  Constitutional: Negative.   HENT: Negative.   Eyes: Negative.   Respiratory: Negative.   Cardiovascular: Negative.   Gastrointestinal: Negative.   Genitourinary: Negative.   Musculoskeletal: Negative.   Skin: Negative.   Neurological: Negative.   Hematological: Negative.   Psychiatric/Behavioral: Negative.        Objective:   Physical Exam  Constitutional: He is oriented to person, place, and time. He appears well-developed and well-nourished.  HENT:  Head: Normocephalic and atraumatic.  Right Ear: External ear normal.  Left Ear: External ear normal.  Nose: Nose normal.  Mouth/Throat: Oropharynx is clear and moist.  Eyes: Conjunctivae and EOM are normal. Pupils are equal, round, and reactive to light.  Neck: Normal range of motion. Neck supple. No JVD present. No tracheal deviation present. No thyromegaly present.  Cardiovascular: Normal rate, regular rhythm, normal heart sounds and intact distal pulses.  Exam reveals no gallop and no friction rub.   No murmur heard. Pulmonary/Chest: Effort normal and breath sounds normal. No stridor. No respiratory distress. He has no wheezes. He has no rales. He exhibits no tenderness.  Abdominal: Soft. Bowel sounds are normal. He exhibits no distension and no mass. There is no tenderness.  There is no rebound and no guarding.  Genitourinary: Rectum normal, prostate normal and penis normal. Guaiac negative stool. No penile tenderness.  Musculoskeletal: Normal range of motion. He exhibits no edema and no tenderness.  Lymphadenopathy:    He has no cervical adenopathy.  Neurological: He is alert and oriented to person, place, and time. He has normal reflexes. No cranial nerve deficit. He exhibits normal muscle tone.  Skin: Skin is warm and dry. No rash noted. No erythema. No pallor.       Total body skin exam normal except for evidence of solar damage diffuse  Psychiatric: He has a normal mood and affect. His behavior is normal. Judgment and thought content normal.          Assessment & Plan:  Healthy male  Erectile dysfunction continue followup by urology  Allergic rhinitis continue Flonase and Claritin  Hyperlipidemia continue Zocor  Chronic sun damage again advise sunscreens SPF 50

## 2012-07-31 ENCOUNTER — Other Ambulatory Visit: Payer: Self-pay | Admitting: Dermatology

## 2012-10-24 ENCOUNTER — Ambulatory Visit: Payer: BC Managed Care – PPO | Admitting: Family Medicine

## 2013-01-26 ENCOUNTER — Other Ambulatory Visit: Payer: Self-pay | Admitting: Family Medicine

## 2013-02-19 ENCOUNTER — Other Ambulatory Visit: Payer: Self-pay | Admitting: Family Medicine

## 2013-09-03 ENCOUNTER — Other Ambulatory Visit: Payer: Self-pay | Admitting: Family Medicine

## 2013-12-05 ENCOUNTER — Ambulatory Visit (INDEPENDENT_AMBULATORY_CARE_PROVIDER_SITE_OTHER): Payer: BC Managed Care – PPO | Admitting: Family Medicine

## 2013-12-05 ENCOUNTER — Encounter: Payer: Self-pay | Admitting: Family Medicine

## 2013-12-05 VITALS — BP 130/80 | HR 79 | Temp 98.8°F | Ht 71.0 in | Wt 187.0 lb

## 2013-12-05 DIAGNOSIS — J019 Acute sinusitis, unspecified: Secondary | ICD-10-CM

## 2013-12-05 MED ORDER — AMOXICILLIN-POT CLAVULANATE 875-125 MG PO TABS: 1.0000 | ORAL_TABLET | Freq: Two times a day (BID) | ORAL | Status: DC

## 2013-12-05 MED ORDER — HYDROCODONE-HOMATROPINE 5-1.5 MG/5ML PO SYRP: 5.0000 mL | ORAL_SOLUTION | ORAL | Status: DC | PRN

## 2013-12-05 NOTE — Progress Notes (Signed)
   Subjective:    Patient ID: Jerry [name], male    DOB: 1950/10/29, 63 y.o.   MRN: [id number]  HPI Here for 4 weeks of stuffy head, sinus pressure, PND and a dry cough. No fever.    Review of Systems  Constitutional: Negative.   HENT: Positive for congestion, postnasal drip and sinus pressure.   Eyes: Negative.   Respiratory: Positive for cough.        Objective:   Physical Exam  Constitutional: He appears well-developed and well-nourished.  HENT:  Right Ear: External ear normal.  Left Ear: External ear normal.  Nose: Nose normal.  Mouth/Throat: Oropharynx is clear and moist.  Eyes: Conjunctivae are normal.  Pulmonary/Chest: Effort normal and breath sounds normal.  Lymphadenopathy:    He has no cervical adenopathy.          Assessment & Plan:  Add Mucinex

## 2013-12-05 NOTE — Progress Notes (Signed)
Pre visit review using our clinic review tool, if applicable. No additional management support is needed unless otherwise documented below in the visit note. 

## 2014-03-17 ENCOUNTER — Other Ambulatory Visit: Payer: Self-pay | Admitting: Family Medicine

## 2014-07-19 ENCOUNTER — Other Ambulatory Visit: Payer: Self-pay | Admitting: Family Medicine

## 2014-10-08 ENCOUNTER — Encounter: Payer: Self-pay | Admitting: Family Medicine

## 2014-10-08 ENCOUNTER — Ambulatory Visit (INDEPENDENT_AMBULATORY_CARE_PROVIDER_SITE_OTHER): Payer: BC Managed Care – PPO | Admitting: Family Medicine

## 2014-10-08 VITALS — BP 128/88 | HR 72 | Temp 97.9°F | Ht 71.0 in | Wt 184.2 lb

## 2014-10-08 DIAGNOSIS — B86 Scabies: Secondary | ICD-10-CM

## 2014-10-08 MED ORDER — PERMETHRIN 5 % EX CREA: 1.0000 "application " | TOPICAL_CREAM | Freq: Once | CUTANEOUS | Status: DC

## 2014-10-08 NOTE — Progress Notes (Signed)
Pre visit review using our clinic review tool, if applicable. No additional management support is needed unless otherwise documented below in the visit note. 

## 2014-10-08 NOTE — Patient Instructions (Signed)

## 2014-10-08 NOTE — Progress Notes (Signed)
HPI:  Acute visit for:  Skin Rash: -for  The last 2 weeks -feels like something bites him and then he scratches it and gets welp - various parts of body -reports was around individual with scabies - in there house a few weeks ago -right now spot on hand and a few other spots -very itchy -no hx of skin issues -no pets at home  ROS: See pertinent positives and negatives per HPI.  Past Medical History  Diagnosis Date  . Allergy   . Hyperlipidemia   . Hypogonadism male     No past surgical history on file.  No family history on file.  History   Social History  . Marital Status: Married    Spouse Name: N/A    Number of Children: N/A  . Years of Education: N/A   Social History Main Topics  . Smoking status: Never Smoker   . Smokeless tobacco: Never Used  . Alcohol Use: Yes     Comment: occ  . Drug Use: No  . Sexual Activity: None   Other Topics Concern  . None   Social History Narrative    Current outpatient prescriptions: AXIRON 30 MG/ACT SOLN, Place 1 application onto the skin 1 dose over 46 hours. , Disp: , Rfl: ;  fish oil-omega-3 fatty acids 1000 MG capsule, Take 1 g by mouth daily., Disp: , Rfl: ;  fluticasone (FLONASE) 50 MCG/ACT nasal spray, PLACE 2 SPRAYS INTO THE NOSE DAILY., Disp: 16 g, Rfl: 2;  loratadine (CLARITIN) 10 MG tablet, Take 10 mg by mouth daily.  , Disp: , Rfl:  Multiple Vitamin (MULTIVITAMIN) tablet, Take 1 tablet by mouth daily., Disp: , Rfl: ;  simvastatin (ZOCOR) 10 MG tablet, Take 1 tablet (10 mg total) by mouth at bedtime., Disp: 100 tablet, Rfl: 3;  tadalafil (CIALIS) 10 MG tablet, Take 1 tablet (10 mg total) by mouth daily as needed., Disp: 10 tablet, Rfl: 11 permethrin (ELIMITE) 5 % cream, Apply 1 application topically once. Apply once from neck down and leave on over night then wash off. May repeat in 1-2 weeks if needed, Disp: 60 g, Rfl: 0  EXAM:  Filed Vitals:   10/08/14 0953  BP: 128/88  Pulse: 72  Temp: 97.9 F (36.6 C)     Body mass index is 25.7 kg/(m^2).  GENERAL: vitals reviewed and listed above, alert, oriented, appears well hydrated and in no acute distress  HEENT: atraumatic, conjunttiva clear, no obvious abnormalities on inspection of external nose and ears  NECK: no obvious masses on inspection  SKIN: few scattered small erythematous papules on hands and arms primarily in web spaces of fingers with burrow marking L hand  MS: moves all extremities without noticeable abnormality  PSYCH: pleasant and cooperative, no obvious depression or anxiety  ASSESSMENT AND PLAN:  Discussed the following assessment and plan:  Scabies - Plan: permethrin (ELIMITE) 5 % cream  -looks like scabies, tx per orders and instruction -Patient advised to return or notify a doctor immediately if symptoms worsen or persist or new concerns arise.  Patient Instructions  Scabies Scabies are small bugs (mites) that burrow under the skin and cause red bumps and severe itching. These bugs can only be seen with a microscope. Scabies are highly contagious. They can spread easily from person to person by direct contact. They are also spread through sharing clothing or linens that have the scabies mites living in them. It is not unusual for an entire family to become infected through shared  towels, clothing, or bedding.  HOME CARE INSTRUCTIONS   Your caregiver may prescribe a cream or lotion to kill the mites. If cream is prescribed, massage the cream into the entire body from the neck to the bottom of both feet. Also massage the cream into the scalp and face if your child is less than 50 year old. Avoid the eyes and mouth. Do not wash your hands after application.  Leave the cream on for 8 to 12 hours. Your child should bathe or shower after the 8 to 12 hour application period. Sometimes it is helpful to apply the cream to your child right before bedtime.  One treatment is usually effective and will eliminate approximately 95%  of infestations. For severe cases, your caregiver may decide to repeat the treatment in 1 week. Everyone in your household should be treated with one application of the cream.  New rashes or burrows should not appear within 24 to 48 hours after successful treatment. However, the itching and rash may last for 2 to 4 weeks after successful treatment. Your caregiver may prescribe a medicine to help with the itching or to help the rash go away more quickly.  Scabies can live on clothing or linens for up to 3 days. All of your child's recently used clothing, towels, stuffed toys, and bed linens should be washed in hot water and then dried in a dryer for at least 20 minutes on high heat. Items that cannot be washed should be enclosed in a plastic bag for at least 3 days.  To help relieve itching, bathe your child in a cool bath or apply cool washcloths to the affected areas.  Your child may return to school after treatment with the prescribed cream. SEEK MEDICAL CARE IF:   The itching persists longer than 4 weeks after treatment.  The rash spreads or becomes infected. Signs of infection include red blisters or yellow-tan crust. Document Released: 10/02/2005 Document Revised: 12/25/2011 Document Reviewed: 02/10/2009 [facility] Patient Information 2015 [place], [place]. This information is not intended to replace advice given to you by your health care provider. Make sure you discuss any questions you have with your health care provider.      Colin Benton R.

## 2014-10-15 ENCOUNTER — Ambulatory Visit: Payer: BC Managed Care – PPO | Admitting: Family Medicine

## 2014-10-20 ENCOUNTER — Encounter: Payer: Self-pay | Admitting: Family Medicine

## 2014-10-20 ENCOUNTER — Ambulatory Visit (INDEPENDENT_AMBULATORY_CARE_PROVIDER_SITE_OTHER): Payer: BLUE CROSS/BLUE SHIELD | Admitting: Family Medicine

## 2014-10-20 VITALS — BP 120/88 | HR 69 | Temp 98.0°F | Ht 71.0 in | Wt 187.2 lb

## 2014-10-20 DIAGNOSIS — J309 Allergic rhinitis, unspecified: Secondary | ICD-10-CM

## 2014-10-20 DIAGNOSIS — B86 Scabies: Secondary | ICD-10-CM

## 2014-10-20 DIAGNOSIS — J069 Acute upper respiratory infection, unspecified: Secondary | ICD-10-CM

## 2014-10-20 MED ORDER — HYDROCODONE-HOMATROPINE 5-1.5 MG/5ML PO SYRP: 5.0000 mL | ORAL_SOLUTION | Freq: Three times a day (TID) | ORAL | Status: DC | PRN

## 2014-10-20 MED ORDER — PERMETHRIN 5 % EX CREA: 1.0000 "application " | TOPICAL_CREAM | Freq: Once | CUTANEOUS | Status: DC

## 2014-10-20 NOTE — Patient Instructions (Signed)
INSTRUCTIONS FOR UPPER RESPIRATORY INFECTION:  -plenty of rest and fluids  -nasal saline wash 2-3 times daily (use prepackaged nasal saline or bottled/distilled water if making your own)   -stop flonase for 2 weeks   -can use afrin nasal spray for drainage and nasal congestion - but do NOT use longer then 3-4 days  -can use tylenol or ibuprofen as directed for aches and sorethroat  -in the winter time, using a humidifier at night is helpful (please follow cleaning instructions)  -if you are taking a cough medication - use only as directed, may also try a teaspoon of honey to coat the throat and throat lozenges  -for sore throat, salt water gargles can help  -follow up if you have fevers, facial pain, tooth pain, difficulty breathing or are worsening or not getting better in 5-7 days

## 2014-10-20 NOTE — Progress Notes (Signed)
HPI:  URI: -started: 1.5 weeks ago -symptoms:nasal congestion, sore throat, cough - improving some but now with persistent cough -denies:fever, SOB, NVD, tooth pain, sinus pain, ear pain -has tried: his allergy treatments -sick contacts/travel/risks: denies flu exposure, tick exposure or or Ebola risks  AR: -taking flonase daily and claritin -a little nose bleed recently  Scabies: -exposure and classic symptoms and rash - treated -reports resolved but then returned -itchy rash on hands and arms  ROS: See pertinent positives and negatives per HPI.  Past Medical History  Diagnosis Date  . Allergy   . Hyperlipidemia   . Hypogonadism male     No past surgical history on file.  No family history on file.  History   Social History  . Marital Status: Married    Spouse Name: N/A    Number of Children: N/A  . Years of Education: N/A   Social History Main Topics  . Smoking status: Never Smoker   . Smokeless tobacco: Never Used  . Alcohol Use: Yes     Comment: occ  . Drug Use: No  . Sexual Activity: None   Other Topics Concern  . None   Social History Narrative    Current outpatient prescriptions: AXIRON 30 MG/ACT SOLN, Place 1 application onto the skin 1 dose over 46 hours. , Disp: , Rfl: ;  fish oil-omega-3 fatty acids 1000 MG capsule, Take 1 g by mouth daily., Disp: , Rfl: ;  fluticasone (FLONASE) 50 MCG/ACT nasal spray, PLACE 2 SPRAYS INTO THE NOSE DAILY., Disp: 16 g, Rfl: 2;  loratadine (CLARITIN) 10 MG tablet, Take 10 mg by mouth daily.  , Disp: , Rfl:  Multiple Vitamin (MULTIVITAMIN) tablet, Take 1 tablet by mouth daily., Disp: , Rfl: ;  simvastatin (ZOCOR) 10 MG tablet, Take 1 tablet (10 mg total) by mouth at bedtime., Disp: 100 tablet, Rfl: 3;  tadalafil (CIALIS) 10 MG tablet, Take 1 tablet (10 mg total) by mouth daily as needed., Disp: 10 tablet, Rfl: 11;  HYDROcodone-homatropine (HYCODAN) 5-1.5 MG/5ML syrup, Take 5 mLs by mouth every 8 (eight) hours as needed  for cough., Disp: 120 mL, Rfl: 0 permethrin (ELIMITE) 5 % cream, Apply 1 application topically once. Apply once from neck down and leave on over night then wash off. May repeat in 1-2 weeks if needed, Disp: 60 g, Rfl: 0  EXAM:  Filed Vitals:   10/20/14 1308  BP: 120/88  Pulse: 69  Temp: 98 F (36.7 C)    Body mass index is 26.12 kg/(m^2).  GENERAL: vitals reviewed and listed above, alert, oriented, appears well hydrated and in no acute distress  HEENT: atraumatic, conjunttiva clear, no obvious abnormalities on inspection of external nose and ears, normal appearance of ear canals and TMs, clear nasal congestion, L ant nasal septal irritation with dried blood, mild post oropharyngeal erythema with PND, no tonsillar edema or exudate, no sinus TTP  NECK: no obvious masses on inspection  LUNGS: clear to auscultation bilaterally, no wheezes, rales or rhonchi, good air movement  CV: HRRR, no peripheral edema  SKIN: few small papules hands web spaces and arm L  MS: moves all extremities without noticeable abnormality  PSYCH: pleasant and cooperative, no obvious depression or anxiety  ASSESSMENT AND PLAN:  Discussed the following assessment and plan:  Acute upper respiratory infection  Scabies  Allergic rhinitis, unspecified allergic rhinitis type  -given HPI and exam findings today, a serious infection or illness is unlikely. We discussed potential etiologies, with VURI being  most likely, and advised supportive care and monitoring. We discussed treatment side effects, likely course, antibiotic misuse, transmission, and signs of developing a serious illness. -retreat scabies, discussed importance of home treatment of bedding, pillows, clothes etc with hot wash or isolation; discussed other possible dx, follow up if recurs -of course, we advised to return or notify a doctor immediately if symptoms worsen or persist or new concerns arise.    Patient Instructions  INSTRUCTIONS FOR  UPPER RESPIRATORY INFECTION:  -plenty of rest and fluids  -nasal saline wash 2-3 times daily (use prepackaged nasal saline or bottled/distilled water if making your own)   -stop flonase for 2 weeks   -can use afrin nasal spray for drainage and nasal congestion - but do NOT use longer then 3-4 days  -can use tylenol or ibuprofen as directed for aches and sorethroat  -in the winter time, using a humidifier at night is helpful (please follow cleaning instructions)  -if you are taking a cough medication - use only as directed, may also try a teaspoon of honey to coat the throat and throat lozenges  -for sore throat, salt water gargles can help  -follow up if you have fevers, facial pain, tooth pain, difficulty breathing or are worsening or not getting better in 5-7 days      Arrabella Westerman R.

## 2014-10-20 NOTE — Progress Notes (Signed)
Pre visit review using our clinic review tool, if applicable. No additional management support is needed unless otherwise documented below in the visit note. 

## 2014-11-24 ENCOUNTER — Other Ambulatory Visit: Payer: Self-pay | Admitting: Dermatology

## 2015-01-26 ENCOUNTER — Other Ambulatory Visit: Payer: Self-pay | Admitting: Family Medicine

## 2015-02-01 ENCOUNTER — Other Ambulatory Visit (INDEPENDENT_AMBULATORY_CARE_PROVIDER_SITE_OTHER): Payer: BLUE CROSS/BLUE SHIELD

## 2015-02-01 DIAGNOSIS — Z0000 Encounter for general adult medical examination without abnormal findings: Secondary | ICD-10-CM | POA: Diagnosis not present

## 2015-02-01 LAB — BASIC METABOLIC PANEL
BUN: 17 mg/dL (ref 6–23)
CALCIUM: 9.6 mg/dL (ref 8.4–10.5)
CO2: 28 meq/L (ref 19–32)
Chloride: 106 mEq/L (ref 96–112)
Creatinine, Ser: 1.12 mg/dL (ref 0.40–1.50)
GFR: 70.51 mL/min (ref >60.00)
Glucose, Bld: 112 mg/dL — ABNORMAL HIGH (ref 70–99)
POTASSIUM: 5.7 meq/L — AB (ref 3.5–5.1)
SODIUM: 139 meq/L (ref 135–145)

## 2015-02-01 LAB — LIPID PANEL
CHOLESTEROL: 168 mg/dL (ref 0–200)
HDL: 30.8 mg/dL — ABNORMAL LOW (ref >39.00)
LDL Cholesterol: 109 mg/dL — ABNORMAL HIGH (ref 0–99)
NonHDL: 137.2
TRIGLYCERIDES: 140 mg/dL (ref 0.0–149.0)
Total CHOL/HDL Ratio: 5
VLDL: 28 mg/dL (ref 0.0–40.0)

## 2015-02-01 LAB — TSH: TSH: 3.32 u[IU]/mL (ref 0.35–4.50)

## 2015-02-01 LAB — HEPATIC FUNCTION PANEL
ALBUMIN: 4.4 g/dL (ref 3.5–5.2)
ALK PHOS: 70 U/L (ref 39–117)
ALT: 40 U/L (ref 0–53)
AST: 28 U/L (ref 0–37)
Bilirubin, Direct: 0.1 mg/dL (ref 0.0–0.3)
Total Bilirubin: 0.5 mg/dL (ref 0.2–1.2)
Total Protein: 6.9 g/dL (ref 6.0–8.3)

## 2015-02-01 LAB — POCT URINALYSIS DIPSTICK
Bilirubin, UA: NEGATIVE
Blood, UA: NEGATIVE
Glucose, UA: NEGATIVE
Ketones, UA: NEGATIVE
LEUKOCYTES UA: NEGATIVE
NITRITE UA: NEGATIVE
PH UA: 5.5
PROTEIN UA: NEGATIVE
Spec Grav, UA: 1.025
Urobilinogen, UA: 0.2

## 2015-02-01 LAB — CBC WITH DIFFERENTIAL/PLATELET
BASOS ABS: 0 10*3/uL (ref 0.0–0.1)
BASOS PCT: 0.8 % (ref 0.0–3.0)
EOS PCT: 2.1 % (ref 0.0–5.0)
Eosinophils Absolute: 0.1 10*3/uL (ref 0.0–0.7)
HEMATOCRIT: 46.5 % (ref 39.0–52.0)
Hemoglobin: 15.9 g/dL (ref 13.0–17.0)
Lymphocytes Relative: 25.2 % (ref 12.0–46.0)
Lymphs Abs: 1 10*3/uL (ref 0.7–4.0)
MCHC: 34.1 g/dL (ref 30.0–36.0)
MCV: 87.4 fl (ref 78.0–100.0)
MONO ABS: 0.5 10*3/uL (ref 0.1–1.0)
Monocytes Relative: 12.5 % — ABNORMAL HIGH (ref 3.0–12.0)
NEUTROS PCT: 59.4 % (ref 43.0–77.0)
Neutro Abs: 2.4 10*3/uL (ref 1.4–7.7)
Platelets: 273 10*3/uL (ref 150.0–400.0)
RBC: 5.32 Mil/uL (ref 4.22–5.81)
RDW: 13.3 % (ref 11.5–15.5)
WBC: 4.1 10*3/uL (ref 4.0–10.5)

## 2015-02-01 LAB — PSA: PSA: 1.43 ng/mL (ref 0.10–4.00)

## 2015-02-08 ENCOUNTER — Encounter: Payer: BLUE CROSS/BLUE SHIELD | Admitting: Family Medicine

## 2015-02-15 ENCOUNTER — Ambulatory Visit (INDEPENDENT_AMBULATORY_CARE_PROVIDER_SITE_OTHER): Payer: BLUE CROSS/BLUE SHIELD | Admitting: Family Medicine

## 2015-02-15 ENCOUNTER — Encounter: Payer: Self-pay | Admitting: Family Medicine

## 2015-02-15 VITALS — Temp 98.3°F | Ht 70.0 in | Wt 186.0 lb

## 2015-02-15 DIAGNOSIS — E785 Hyperlipidemia, unspecified: Secondary | ICD-10-CM

## 2015-02-15 DIAGNOSIS — E781 Pure hyperglyceridemia: Secondary | ICD-10-CM | POA: Diagnosis not present

## 2015-02-15 DIAGNOSIS — Z0000 Encounter for general adult medical examination without abnormal findings: Secondary | ICD-10-CM | POA: Diagnosis not present

## 2015-02-15 DIAGNOSIS — Z23 Encounter for immunization: Secondary | ICD-10-CM | POA: Diagnosis not present

## 2015-02-15 DIAGNOSIS — R739 Hyperglycemia, unspecified: Secondary | ICD-10-CM

## 2015-02-15 MED ORDER — SIMVASTATIN 10 MG PO TABS: 10.0000 mg | ORAL_TABLET | Freq: Every day | ORAL | Status: DC

## 2015-02-15 NOTE — Patient Instructions (Signed)
Take the testosterone supplement Monday Wednesday Friday  Walk 30 minutes daily  Avoid sugar  Purchase a Omron pump up digital blood pressure cuff......Marland Kitchen [place] your blood pressure in the morning weekly  Return in September for follow-up  Labs fasting one week prior  When you return in September bring a record of all your blood pressure readings  Continue the Zocor and aspirin

## 2015-02-15 NOTE — Progress Notes (Signed)
Pre visit review using our clinic review tool, if applicable. No additional management support is needed unless otherwise documented below in the visit note. 

## 2015-02-15 NOTE — Progress Notes (Signed)
   Subjective:    Patient ID: Jerry [name], male    DOB: [birth year], 64 y.o.   MRN: [id number]  HPI Kaycee is a 64 year old male nonsmoker who comes in today for general physical examination because of a history of hyperlipidemia  He takes Zocor 10 mg daily lipids are at goal  His weight is 214 pounds very muscular not obese  He also takes a testosterone supplement from his urologist for low T  His blood pressure today is borderline 140/90  He gets eyes checked every 2 years, regular dental care, colonoscopy and the [place] 6 years ago normal he tells me  Vaccinations up-to-date........ tetanus booster given today........ information given on shingles vaccine   Review of Systems  Constitutional: Negative.   HENT: Negative.   Eyes: Negative.   Respiratory: Negative.   Cardiovascular: Negative.   Gastrointestinal: Negative.   Endocrine: Negative.   Genitourinary: Negative.   Musculoskeletal: Negative.   Skin: Negative.   Allergic/Immunologic: Negative.   Neurological: Negative.   Hematological: Negative.   Psychiatric/Behavioral: Negative.        Objective:   Physical Exam  Constitutional: He is oriented to person, place, and time. He appears well-developed and well-nourished.  HENT:  Head: Normocephalic and atraumatic.  Right Ear: External ear normal.  Left Ear: External ear normal.  Nose: Nose normal.  Mouth/Throat: Oropharynx is clear and moist.  Eyes: Conjunctivae and EOM are normal. Pupils are equal, round, and reactive to light.  Neck: Normal range of motion. Neck supple. No JVD present. No tracheal deviation present. No thyromegaly present.  Cardiovascular: Normal rate, regular rhythm, normal heart sounds and intact distal pulses.  Exam reveals no gallop and no friction rub.   No murmur heard. Pulmonary/Chest: Effort normal and breath sounds normal. No stridor. No respiratory distress. He has no wheezes. He has no rales. He exhibits no tenderness.    Abdominal: Soft. Bowel sounds are normal. He exhibits no distension and no mass. There is no tenderness. There is no rebound and no guarding.  Genitourinary:  Genital prostate etc. done by urologist therefore not repeated  Musculoskeletal: Normal range of motion. He exhibits no edema or tenderness.  Lymphadenopathy:    He has no cervical adenopathy.  Neurological: He is alert and oriented to person, place, and time. He has normal reflexes. No cranial nerve deficit. He exhibits normal muscle tone.  Skin: Skin is warm and dry. No rash noted. No erythema. No pallor.  Psychiatric: He has a normal mood and affect. His behavior is normal. Judgment and thought content normal.  Nursing note and vitals reviewed.         Assessment & Plan:  Healthy male  Borderline hypertension...... question elevation from steroids........ BP check daily......... decrease steroid in 2 Monday Wednesday Friday follow-up in September  Elevated blood sugar question related to testosterone......... decreased testosterone to Monday Wednesday Friday...... avoid sugar.... Walk 30 minutes daily..... Return in September for follow-up  Hyperlipidemia continue Zocor and aspirin

## 2015-02-22 ENCOUNTER — Encounter: Payer: Self-pay | Admitting: Internal Medicine

## 2015-02-23 ENCOUNTER — Encounter: Payer: Self-pay | Admitting: Internal Medicine

## 2015-04-21 ENCOUNTER — Encounter: Payer: Self-pay | Admitting: Internal Medicine

## 2015-06-15 ENCOUNTER — Other Ambulatory Visit (INDEPENDENT_AMBULATORY_CARE_PROVIDER_SITE_OTHER): Payer: BLUE CROSS/BLUE SHIELD

## 2015-06-15 DIAGNOSIS — R7309 Other abnormal glucose: Secondary | ICD-10-CM | POA: Diagnosis not present

## 2015-06-15 DIAGNOSIS — R739 Hyperglycemia, unspecified: Secondary | ICD-10-CM

## 2015-06-15 LAB — BASIC METABOLIC PANEL
BUN: 15 mg/dL (ref 6–23)
CHLORIDE: 102 meq/L (ref 96–112)
CO2: 29 meq/L (ref 19–32)
Calcium: 9.1 mg/dL (ref 8.4–10.5)
Creatinine, Ser: 0.97 mg/dL (ref 0.40–1.50)
GFR: 83.14 mL/min (ref >60.00)
GLUCOSE: 93 mg/dL (ref 70–99)
POTASSIUM: 4.2 meq/L (ref 3.5–5.1)
Sodium: 136 mEq/L (ref 135–145)

## 2015-06-15 LAB — HEMOGLOBIN A1C: Hgb A1c MFr Bld: 5.2 % (ref 4.6–6.5)

## 2015-06-17 ENCOUNTER — Ambulatory Visit (AMBULATORY_SURGERY_CENTER): Payer: Self-pay | Admitting: *Deleted

## 2015-06-17 VITALS — Ht 71.0 in | Wt 185.2 lb

## 2015-06-17 DIAGNOSIS — Z1211 Encounter for screening for malignant neoplasm of colon: Secondary | ICD-10-CM

## 2015-06-17 NOTE — Progress Notes (Signed)
Denies allergies to eggs or soy products. Denies complications with sedation or anesthesia. Denies O2 use. Denies use of diet or weight loss medications.  Emmi instructions given for colonoscopy.  

## 2015-06-22 ENCOUNTER — Ambulatory Visit (INDEPENDENT_AMBULATORY_CARE_PROVIDER_SITE_OTHER): Payer: BLUE CROSS/BLUE SHIELD | Admitting: Family Medicine

## 2015-06-22 ENCOUNTER — Encounter: Payer: Self-pay | Admitting: Family Medicine

## 2015-06-22 VITALS — BP 140/90 | Temp 98.0°F | Wt 186.0 lb

## 2015-06-22 DIAGNOSIS — R030 Elevated blood-pressure reading, without diagnosis of hypertension: Secondary | ICD-10-CM | POA: Diagnosis not present

## 2015-06-22 DIAGNOSIS — Z23 Encounter for immunization: Secondary | ICD-10-CM

## 2015-06-22 NOTE — Progress Notes (Signed)
Pre visit review using our clinic review tool, if applicable. No additional management support is needed unless otherwise documented below in the visit note. 

## 2015-06-22 NOTE — Progress Notes (Signed)
   Subjective:    Patient ID: Jerry [name], male    DOB: 1951-01-17, 64 y.o.   MRN: [id number]  HPI Penn is a 64 year old divorce male nonsmoker who comes in today for evaluation of elevated blood pressure and abnormal blood sugar  He's been monitoring his blood pressure at home his blood pressure averages 120/70. His blood pressure today is 140/90. He has an element of white coat hypertension  He had an elevated blood sugar however his blood sugar is normal fasting 93 with an A1c of 5.2%.  Last physical April 2016   Review of Systems Review of systems negative except he continues to get the testosterone supplement    Objective:   Physical Exam Well-developed well-nourished male no acute distress vital signs stable is afebrile BP right arm sitting position 140/90       Assessment & Plan:  Elevated blood pressure without hypertension element of white coat hypertension......... normal blood pressures at home  Normal blood sugar and A1c

## 2015-06-22 NOTE — Patient Instructions (Signed)
Check your blood pressure weekly at home  Return in April for general physical examination  Fasting labs one week prior

## 2015-07-01 ENCOUNTER — Ambulatory Visit (AMBULATORY_SURGERY_CENTER): Payer: BLUE CROSS/BLUE SHIELD | Admitting: Internal Medicine

## 2015-07-01 ENCOUNTER — Encounter: Payer: Self-pay | Admitting: Internal Medicine

## 2015-07-01 VITALS — BP 107/69 | HR 54 | Temp 98.1°F | Resp 15 | Ht 71.0 in | Wt 185.0 lb

## 2015-07-01 DIAGNOSIS — Z1211 Encounter for screening for malignant neoplasm of colon: Secondary | ICD-10-CM | POA: Diagnosis not present

## 2015-07-01 MED ORDER — SODIUM CHLORIDE 0.9 % IV SOLN: 500.0000 mL | INTRAVENOUS | Status: DC

## 2015-07-01 NOTE — Op Note (Signed)
[facility]  [address] [place], [address]   COLONOSCOPY PROCEDURE REPORT  PATIENT: Erin, Uecker  MR#: [id number] BIRTHDATE: 1951/04/29 , 56  yrs. old GENDER: male ENDOSCOPIST: Gatha Mayer, MD, [facility] PROCEDURE DATE:  07/01/2015 PROCEDURE:   Colonoscopy, screening First Screening Colonoscopy - Avg.  risk and is 50 yrs.  old or older - No.  Prior Negative Screening - Now for repeat screening. 10 or more years since last screening  History of Adenoma - Now for follow-up colonoscopy & has been > or = to 3 yrs.  N/A  Polyps removed today? No Recommend repeat exam, <10 yrs? No ASA CLASS:   Class II INDICATIONS:Screening for colonic neoplasia and Colorectal Neoplasm Risk Assessment for this procedure is average risk. MEDICATIONS: Propofol 170 mg IV and Monitored anesthesia care  DESCRIPTION OF PROCEDURE:   After the risks benefits and alternatives of the procedure were thoroughly explained, informed consent was obtained.  The digital rectal exam revealed no abnormalities of the rectum.   The LB [id number]  endoscope was introduced through the anus and advanced to the cecum, which was identified by both the appendix and ileocecal valve. No adverse events experienced.   The quality of the prep was excellent. (MiraLax was used)  The instrument was then slowly withdrawn as the colon was fully examined. Estimated blood loss is zero unless otherwise noted in this procedure report.      COLON FINDINGS: A normal appearing cecum, ileocecal valve, and appendiceal orifice were identified.  The ascending, transverse, descending, sigmoid colon, and rectum appeared unremarkable. Right colon retroflexion included.  Retroflexed views revealed no abnormalities. The time to cecum = 1.9 Withdrawal time = 7.0   The scope was withdrawn and the procedure completed. COMPLICATIONS: There were no immediate complications.  ENDOSCOPIC IMPRESSION: Normal colonoscopy -  excellent prep - second screening  RECOMMENDATIONS: Repeat colonoscopy/screening test 10 years.  2026  eSigned:  Gatha Mayer, MD, [facility] 07/01/2015 11:17 AM   cc: The Patient

## 2015-07-01 NOTE — Progress Notes (Signed)
Report to PACU, RN, vss, BBS= Clear.  

## 2015-07-01 NOTE — Patient Instructions (Addendum)
Colonoscopy was normal.  Next routine colonoscopy/screening test in 10 years - 2026  I appreciate the opportunity to care for you. Gatha Mayer, MD, [facility]  Discharge instructions given. Normal exam. Resume previous medications. YOU HAD AN ENDOSCOPIC PROCEDURE TODAY AT [facility]:   Refer to the procedure report that was given to you for any specific questions about what was found during the examination.  If the procedure report does not answer your questions, please call your gastroenterologist to clarify.  If you requested that your care partner not be given the details of your procedure findings, then the procedure report has been included in a sealed envelope for you to review at your convenience later.  YOU SHOULD EXPECT: Some feelings of bloating in the abdomen. Passage of more gas than usual.  Walking can help get rid of the air that was put into your GI tract during the procedure and reduce the bloating. If you had a lower endoscopy (such as a colonoscopy or flexible sigmoidoscopy) you may notice spotting of blood in your stool or on the toilet paper. If you underwent a bowel prep for your procedure, you may not have a normal bowel movement for a few days.  Please Note:  You might notice some irritation and congestion in your nose or some drainage.  This is from the oxygen used during your procedure.  There is no need for concern and it should clear up in a day or so.  SYMPTOMS TO REPORT IMMEDIATELY:   Following lower endoscopy (colonoscopy or flexible sigmoidoscopy):  Excessive amounts of blood in the stool  Significant tenderness or worsening of abdominal pains  Swelling of the abdomen that is new, acute  Fever of 100F or higher   For urgent or emergent issues, a gastroenterologist can be reached at any hour by calling [phone].   DIET: Your first meal following the procedure should be a small meal and then it is ok to progress to your normal  diet. Heavy or fried foods are harder to digest and may make you feel nauseous or bloated.  Likewise, meals heavy in dairy and vegetables can increase bloating.  Drink plenty of fluids but you should avoid alcoholic beverages for 24 hours.  ACTIVITY:  You should plan to take it easy for the rest of today and you should NOT DRIVE or use heavy machinery until tomorrow (because of the sedation medicines used during the test).    FOLLOW UP: Our staff will call the number listed on your records the next business day following your procedure to check on you and address any questions or concerns that you may have regarding the information given to you following your procedure. If we do not reach you, we will leave a message.  However, if you are feeling well and you are not experiencing any problems, there is no need to return our call.  We will assume that you have returned to your regular daily activities without incident.  If any biopsies were taken you will be contacted by phone or by letter within the next 1-3 weeks.  Please call us at [phone] if you have not heard about the biopsies in 3 weeks.    SIGNATURES/CONFIDENTIALITY: You and/or your care partner have signed paperwork which will be entered into your electronic medical record.  These signatures attest to the fact that that the information above on your After Visit Summary has been reviewed and is understood.  Full responsibility of  the confidentiality of this discharge information lies with you and/or your care-partner. 

## 2015-07-02 ENCOUNTER — Telehealth: Payer: Self-pay | Admitting: *Deleted

## 2015-07-02 NOTE — Telephone Encounter (Signed)
  Follow up Call-  Call back number 07/01/2015  Post procedure Call Back phone  # [phone]  Permission to leave phone message Yes     Patient questions:  Do you have a fever, pain , or abdominal swelling? No. Pain Score  0 *  Have you tolerated food without any problems? Yes.    Have you been able to return to your normal activities? Yes.    Do you have any questions about your discharge instructions: Diet   No. Medications  No. Follow up visit  No.  Do you have questions or concerns about your Care? No.  Actions: * If pain score is 4 or above: No action needed, pain <4.

## 2016-02-14 ENCOUNTER — Other Ambulatory Visit: Payer: BLUE CROSS/BLUE SHIELD

## 2016-02-15 ENCOUNTER — Other Ambulatory Visit (INDEPENDENT_AMBULATORY_CARE_PROVIDER_SITE_OTHER): Payer: BLUE CROSS/BLUE SHIELD

## 2016-02-15 DIAGNOSIS — R030 Elevated blood-pressure reading, without diagnosis of hypertension: Secondary | ICD-10-CM

## 2016-02-15 LAB — POCT URINALYSIS DIPSTICK
BILIRUBIN UA: NEGATIVE
GLUCOSE UA: NEGATIVE
Ketones, UA: NEGATIVE
Leukocytes, UA: NEGATIVE
NITRITE UA: NEGATIVE
RBC UA: NEGATIVE
Spec Grav, UA: 1.025
Urobilinogen, UA: 0.2
pH, UA: 7

## 2016-02-15 LAB — CBC WITH DIFFERENTIAL/PLATELET
BASOS ABS: 0 10*3/uL (ref 0.0–0.1)
BASOS PCT: 0.7 % (ref 0.0–3.0)
EOS PCT: 3 % (ref 0.0–5.0)
Eosinophils Absolute: 0.1 10*3/uL (ref 0.0–0.7)
HEMATOCRIT: 47.1 % (ref 39.0–52.0)
Hemoglobin: 16 g/dL (ref 13.0–17.0)
LYMPHS PCT: 18.4 % (ref 12.0–46.0)
Lymphs Abs: 0.9 10*3/uL (ref 0.7–4.0)
MCHC: 34 g/dL (ref 30.0–36.0)
MCV: 89.8 fl (ref 78.0–100.0)
MONOS PCT: 10.9 % (ref 3.0–12.0)
Monocytes Absolute: 0.5 10*3/uL (ref 0.1–1.0)
NEUTROS ABS: 3.3 10*3/uL (ref 1.4–7.7)
Neutrophils Relative %: 67 % (ref 43.0–77.0)
PLATELETS: 286 10*3/uL (ref 150.0–400.0)
RBC: 5.24 Mil/uL (ref 4.22–5.81)
RDW: 13.7 % (ref 11.5–15.5)
WBC: 4.9 10*3/uL (ref 4.0–10.5)

## 2016-02-15 LAB — HEPATIC FUNCTION PANEL
ALBUMIN: 4.3 g/dL (ref 3.5–5.2)
ALT: 30 U/L (ref 0–53)
AST: 27 U/L (ref 0–37)
Alkaline Phosphatase: 66 U/L (ref 39–117)
BILIRUBIN DIRECT: 0.1 mg/dL (ref 0.0–0.3)
BILIRUBIN TOTAL: 0.9 mg/dL (ref 0.2–1.2)
Total Protein: 7 g/dL (ref 6.0–8.3)

## 2016-02-15 LAB — LIPID PANEL
CHOLESTEROL: 208 mg/dL — AB (ref 0–200)
HDL: 43.4 mg/dL (ref >39.00)
NonHDL: 164.43
Total CHOL/HDL Ratio: 5
Triglycerides: 212 mg/dL — ABNORMAL HIGH (ref 0.0–149.0)
VLDL: 42.4 mg/dL — ABNORMAL HIGH (ref 0.0–40.0)

## 2016-02-15 LAB — BASIC METABOLIC PANEL
BUN: 18 mg/dL (ref 6–23)
CO2: 29 mEq/L (ref 19–32)
Calcium: 9.4 mg/dL (ref 8.4–10.5)
Chloride: 102 mEq/L (ref 96–112)
Creatinine, Ser: 0.99 mg/dL (ref 0.40–1.50)
GFR: 81.03 mL/min (ref >60.00)
Glucose, Bld: 98 mg/dL (ref 70–99)
Potassium: 4.4 mEq/L (ref 3.5–5.1)
Sodium: 138 mEq/L (ref 135–145)

## 2016-02-15 LAB — PSA: PSA: 1.5 ng/mL (ref 0.10–4.00)

## 2016-02-15 LAB — LDL CHOLESTEROL, DIRECT: LDL DIRECT: 125 mg/dL

## 2016-02-15 LAB — TSH: TSH: 4.89 u[IU]/mL — ABNORMAL HIGH (ref 0.35–4.50)

## 2016-02-20 ENCOUNTER — Other Ambulatory Visit: Payer: Self-pay | Admitting: Family Medicine

## 2016-02-21 ENCOUNTER — Ambulatory Visit (INDEPENDENT_AMBULATORY_CARE_PROVIDER_SITE_OTHER): Payer: BLUE CROSS/BLUE SHIELD | Admitting: Family Medicine

## 2016-02-21 ENCOUNTER — Encounter: Payer: Self-pay | Admitting: Family Medicine

## 2016-02-21 VITALS — BP 140/90 | Temp 98.7°F | Ht 70.5 in | Wt 184.0 lb

## 2016-02-21 DIAGNOSIS — E785 Hyperlipidemia, unspecified: Secondary | ICD-10-CM

## 2016-02-21 DIAGNOSIS — E781 Pure hyperglyceridemia: Secondary | ICD-10-CM

## 2016-02-21 DIAGNOSIS — Z0000 Encounter for general adult medical examination without abnormal findings: Secondary | ICD-10-CM | POA: Insufficient documentation

## 2016-02-21 MED ORDER — SIMVASTATIN 20 MG PO TABS: 20.0000 mg | ORAL_TABLET | Freq: Every day | ORAL | Status: DC

## 2016-02-21 NOTE — Patient Instructions (Signed)
Increase the Zocor to 20 mg daily  Take an aspirin tablet along with it  See your dermatologist the spring. The lesion on that left ear appears to be an actinic keratosis  Follow-up in one year sooner if any problems  Jerry [name] our new adult nurse practitioner,,,,,,,,,,, call in October for your physical and he get established for long-term care

## 2016-02-21 NOTE — Progress Notes (Signed)
Subjective:    Patient ID: Jerry [name], male    DOB: 1951/03/27, 65 y.o.   MRN: [id number]  HPI [name] is a 65 year old married male nonsmoker comes in today for general physical examination  Has a history of hyperlipidemia on Zocor 10 mg daily. HDL 43 LDL 125 we'll increase his Zocor to 20 mg daily  He takes Flonase and Claritin when necessary for allergic rhinitis  He also sees Dr. 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 He is on a combination of testosterone supplementations and Cialis 10 mg when necessary  He gets routine eye care, dental care, colonoscopy 2016 was normal  He has light skin and light eyes. He's had a lot of sun exposure used of a swimming pool in his backyard. He sees his dermatologist on a yearly basis. Says the only lesions problem is he has one on his left ear that seems to be increasing in size. He saw his dermatologist 2 months ago but they were concerned about it.  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   HENT: Negative.   Eyes: Negative.   Respiratory: Negative.   Cardiovascular: Negative.   Gastrointestinal: Negative.   Endocrine: Negative.   Genitourinary: Negative.   Musculoskeletal: Negative.   Skin: Negative.   Allergic/Immunologic: Negative.   Neurological: Negative.   Hematological: Negative.   Psychiatric/Behavioral: Negative.        Objective:   Physical Exam  Constitutional: He is oriented to person, place, and time. He appears well-developed and well-nourished.  HENT:  Head: Normocephalic and atraumatic.  Right Ear: External ear normal.  Left Ear: External ear normal.  Nose: Nose normal.  Mouth/Throat: Oropharynx is clear and moist.  Eyes: Conjunctivae and EOM are normal. Pupils are equal, round, and reactive to light.  Neck: Normal range of motion. Neck supple. No JVD present. No tracheal deviation present.  No thyromegaly present.  Cardiovascular: Normal rate, regular rhythm, normal heart sounds and intact distal pulses.  Exam reveals no gallop and no friction rub.   No murmur heard. Pulmonary/Chest: Effort normal and breath sounds normal. No stridor. No respiratory distress. He has no wheezes. He has no rales. He exhibits no tenderness.  Abdominal: Soft. Bowel sounds are normal. He exhibits no distension and no mass. There is no tenderness. There is no rebound and no guarding.  Genitourinary:  Genitorectal exam deferred done by urologist or 4 not repeated  Musculoskeletal: Normal range of motion. He exhibits no edema or tenderness.  Lymphadenopathy:    He has no cervical adenopathy.  Neurological: He is alert and oriented to person, place, and time. He has normal reflexes. No cranial nerve deficit. He exhibits normal muscle tone.  Skin: Skin is warm and dry. No rash noted. No erythema. No pallor.  Tells again exam is normal. 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 Advised to recheck with his dermatologist  Psychiatric: He has a normal mood and affect. His behavior is normal. Judgment and thought content normal.  Nursing note and vitals reviewed.         Assessment & Plan:  Healthy male  Hyperlipidemia............... increase Zocor to 20 mg daily  Erectile dysfunction secondary low testosterone........ followed by urology  He allergic rhinitis Claritin and Flonase when necessary  Chronic sun damage............ advised to followed by dermatology yearly  plus see them about the lesion in his left ear. It appears to be an actinic keratosis.

## 2016-02-21 NOTE — Progress Notes (Signed)
Pre visit review using our clinic review tool, if applicable. No additional management support is needed unless otherwise documented below in the visit note. 

## 2016-03-16 ENCOUNTER — Ambulatory Visit (INDEPENDENT_AMBULATORY_CARE_PROVIDER_SITE_OTHER): Payer: BLUE CROSS/BLUE SHIELD | Admitting: *Deleted

## 2016-03-16 DIAGNOSIS — Z23 Encounter for immunization: Secondary | ICD-10-CM | POA: Diagnosis not present

## 2016-11-16 DIAGNOSIS — G459 Transient cerebral ischemic attack, unspecified: Secondary | ICD-10-CM

## 2016-11-16 HISTORY — DX: Transient cerebral ischemic attack, unspecified: G45.9

## 2016-12-07 ENCOUNTER — Observation Stay (HOSPITAL_COMMUNITY)
Admission: EM | Admit: 2016-12-07 | Discharge: 2016-12-08 | Disposition: A | Discharge status: Home / Self Care | Payer: BLUE CROSS/BLUE SHIELD | Attending: Internal Medicine | Admitting: Internal Medicine

## 2016-12-07 ENCOUNTER — Telehealth: Payer: Self-pay | Admitting: Family Medicine

## 2016-12-07 ENCOUNTER — Observation Stay (HOSPITAL_COMMUNITY): Payer: BLUE CROSS/BLUE SHIELD

## 2016-12-07 ENCOUNTER — Emergency Department (HOSPITAL_COMMUNITY): Payer: BLUE CROSS/BLUE SHIELD

## 2016-12-07 ENCOUNTER — Encounter (HOSPITAL_COMMUNITY): Payer: Self-pay | Admitting: Emergency Medicine

## 2016-12-07 DIAGNOSIS — N529 Male erectile dysfunction, unspecified: Secondary | ICD-10-CM | POA: Diagnosis not present

## 2016-12-07 DIAGNOSIS — R200 Anesthesia of skin: Secondary | ICD-10-CM

## 2016-12-07 DIAGNOSIS — R7989 Other specified abnormal findings of blood chemistry: Secondary | ICD-10-CM | POA: Diagnosis present

## 2016-12-07 DIAGNOSIS — R030 Elevated blood-pressure reading, without diagnosis of hypertension: Secondary | ICD-10-CM | POA: Diagnosis present

## 2016-12-07 DIAGNOSIS — Z87891 Personal history of nicotine dependence: Secondary | ICD-10-CM | POA: Diagnosis not present

## 2016-12-07 DIAGNOSIS — I639 Cerebral infarction, unspecified: Secondary | ICD-10-CM | POA: Diagnosis not present

## 2016-12-07 DIAGNOSIS — E781 Pure hyperglyceridemia: Secondary | ICD-10-CM | POA: Diagnosis present

## 2016-12-07 DIAGNOSIS — G459 Transient cerebral ischemic attack, unspecified: Secondary | ICD-10-CM | POA: Diagnosis present

## 2016-12-07 DIAGNOSIS — E291 Testicular hypofunction: Secondary | ICD-10-CM | POA: Diagnosis not present

## 2016-12-07 DIAGNOSIS — Z7982 Long term (current) use of aspirin: Secondary | ICD-10-CM | POA: Diagnosis not present

## 2016-12-07 DIAGNOSIS — R202 Paresthesia of skin: Secondary | ICD-10-CM

## 2016-12-07 LAB — COMPREHENSIVE METABOLIC PANEL
ALK PHOS: 73 U/L (ref 38–126)
ALT: 40 U/L (ref 17–63)
AST: 31 U/L (ref 15–41)
Albumin: 4.8 g/dL (ref 3.5–5.0)
Anion gap: 4 — ABNORMAL LOW (ref 5–15)
BILIRUBIN TOTAL: 1 mg/dL (ref 0.3–1.2)
BUN: 23 mg/dL — AB (ref 6–20)
CALCIUM: 9.6 mg/dL (ref 8.9–10.3)
CHLORIDE: 107 mmol/L (ref 101–111)
CO2: 26 mmol/L (ref 22–32)
CREATININE: 1.03 mg/dL (ref 0.61–1.24)
GFR calc Af Amer: >60 mL/min (ref >60)
Glucose, Bld: 110 mg/dL — ABNORMAL HIGH (ref 65–99)
Potassium: 4.6 mmol/L (ref 3.5–5.1)
Sodium: 137 mmol/L (ref 135–145)
TOTAL PROTEIN: 8.1 g/dL (ref 6.5–8.1)

## 2016-12-07 LAB — ECHOCARDIOGRAM COMPLETE
Height: 71 in
Weight: 2960 oz

## 2016-12-07 LAB — I-STAT CHEM 8, ED
BUN: 25 mg/dL — AB (ref 6–20)
CREATININE: 1 mg/dL (ref 0.61–1.24)
Calcium, Ion: 1.18 mmol/L (ref 1.15–1.40)
Chloride: 104 mmol/L (ref 101–111)
GLUCOSE: 105 mg/dL — AB (ref 65–99)
HCT: 46 % (ref 39.0–52.0)
Hemoglobin: 15.6 g/dL (ref 13.0–17.0)
Potassium: 4.5 mmol/L (ref 3.5–5.1)
Sodium: 138 mmol/L (ref 135–145)
TCO2: 27 mmol/L (ref 0–100)

## 2016-12-07 LAB — DIFFERENTIAL
BASOS ABS: 0.1 10*3/uL (ref 0.0–0.1)
Basophils Relative: 1 %
Eosinophils Absolute: 0.1 10*3/uL (ref 0.0–0.7)
Eosinophils Relative: 2 %
LYMPHS ABS: 1.1 10*3/uL (ref 0.7–4.0)
LYMPHS PCT: 21 %
MONO ABS: 0.7 10*3/uL (ref 0.1–1.0)
MONOS PCT: 13 %
NEUTROS ABS: 3.3 10*3/uL (ref 1.7–7.7)
Neutrophils Relative %: 63 %

## 2016-12-07 LAB — PROTIME-INR
INR: 1
Prothrombin Time: 13.2 seconds (ref 11.4–15.2)

## 2016-12-07 LAB — CBC
HEMATOCRIT: 43.6 % (ref 39.0–52.0)
HEMOGLOBIN: 15.9 g/dL (ref 13.0–17.0)
MCH: 31.2 pg (ref 26.0–34.0)
MCHC: 36.5 g/dL — ABNORMAL HIGH (ref 30.0–36.0)
MCV: 85.5 fL (ref 78.0–100.0)
Platelets: 268 10*3/uL (ref 150–400)
RBC: 5.1 MIL/uL (ref 4.22–5.81)
RDW: 12.7 % (ref 11.5–15.5)
WBC: 5.2 10*3/uL (ref 4.0–10.5)

## 2016-12-07 LAB — I-STAT TROPONIN, ED: Troponin i, poc: 0 ng/mL (ref 0.00–0.08)

## 2016-12-07 LAB — CBG MONITORING, ED: Glucose-Capillary: 119 mg/dL — ABNORMAL HIGH (ref 65–99)

## 2016-12-07 LAB — APTT: aPTT: 29 seconds (ref 24–36)

## 2016-12-07 MED ORDER — LORATADINE 10 MG PO TABS: 10.0000 mg | ORAL_TABLET | Freq: Every day | ORAL | Status: DC | PRN

## 2016-12-07 MED ORDER — ACETAMINOPHEN 325 MG PO TABS: 650.0000 mg | ORAL_TABLET | ORAL | Status: DC | PRN

## 2016-12-07 MED ORDER — ENOXAPARIN SODIUM 40 MG/0.4ML ~~LOC~~ SOLN: 40.0000 mg | SUBCUTANEOUS | Status: DC

## 2016-12-07 MED ORDER — ACETAMINOPHEN 160 MG/5ML PO SOLN: 650.0000 mg | ORAL | Status: DC | PRN

## 2016-12-07 MED ORDER — TESTOSTERONE 30 MG/ACT TD SOLN
1.0000 "application " | Freq: Every day | TRANSDERMAL | Status: DC
  Administered 2016-12-08: 1 via TRANSDERMAL

## 2016-12-07 MED ORDER — FLUTICASONE PROPIONATE 50 MCG/ACT NA SUSP
2.0000 | Freq: Every day | NASAL | Status: DC | PRN
  Filled 2016-12-07: qty 16

## 2016-12-07 MED ORDER — ASPIRIN 325 MG PO TABS
325.0000 mg | ORAL_TABLET | Freq: Every day | ORAL | Status: DC
  Administered 2016-12-07 – 2016-12-08 (×2): 325 mg via ORAL
  Filled 2016-12-07 (×2): qty 1

## 2016-12-07 MED ORDER — ASPIRIN 300 MG RE SUPP: 300.0000 mg | Freq: Every day | RECTAL | Status: DC

## 2016-12-07 MED ORDER — SIMVASTATIN 10 MG PO TABS
10.0000 mg | ORAL_TABLET | Freq: Every day | ORAL | Status: DC
  Administered 2016-12-07: 10 mg via ORAL
  Filled 2016-12-07 (×2): qty 1

## 2016-12-07 MED ORDER — STROKE: EARLY STAGES OF RECOVERY BOOK
Freq: Once | Status: AC
  Administered 2016-12-07: 13:00:00
  Filled 2016-12-07: qty 1

## 2016-12-07 MED ORDER — ACETAMINOPHEN 650 MG RE SUPP
650.0000 mg | RECTAL | Status: DC | PRN
Start: 2016-12-07 — End: 2016-12-08

## 2016-12-07 MED ORDER — IOPAMIDOL (ISOVUE-370) INJECTION 76%
INTRAVENOUS | Status: AC
  Administered 2016-12-07: 50 mL
  Filled 2016-12-07: qty 50

## 2016-12-07 MED ORDER — ADULT MULTIVITAMIN W/MINERALS CH
1.0000 | ORAL_TABLET | Freq: Every day | ORAL | Status: DC
  Administered 2016-12-08: 1 via ORAL
  Filled 2016-12-07: qty 1

## 2016-12-07 MED ORDER — OMEGA-3-ACID ETHYL ESTERS 1 G PO CAPS
1.0000 g | ORAL_CAPSULE | Freq: Every day | ORAL | Status: DC
  Administered 2016-12-08: 1 g via ORAL
  Filled 2016-12-07: qty 1

## 2016-12-07 NOTE — ED Provider Notes (Signed)
[facility] DEPT Provider Note   CSN: [id number] Arrival date & time: 12/07/16  1044     History   Chief Complaint Chief Complaint  Patient presents with  . Numbness    left side of face and arm   . Headache    HPI Jerry [name] is a 66 y.o. male.  HPI   66 year old male with past medical history of hyperlipidemia who presents with left facial numbness and arm numbness. Patient states that his symptoms first started Monday. He spent the weekend at the [facility]. On driving home on Monday, he experienced acute onset of left face and arm numbness. He had no weakness or difficulty speaking or swallowing. The symptoms lasted 45 minutes and resolved. He had the symptoms again to stay with acute onset of left face and arm numbness. They again resolved over 45 minutes and he was planning to see his PCP for this. Earlier this morning, at approximately 945, he again experienced acute onset of left facial and upper extremity numbness. Denies any associated weakness. No vision changes. Gait is normal. He has no history of known stroke. He called his physician who advised him to report to the ER immediately. He has ongoing numbness in the arm but does feel that his symptoms are slightly improving. He is not on blood thinners.  Past Medical History:  Diagnosis Date  . Allergy   . Hyperlipidemia   . Hypogonadism male     Patient Active Problem List   Diagnosis Date Noted  . Routine general medical examination at a health care facility 02/21/2016  . Elevated blood pressure reading without diagnosis of hypertension 06/22/2015  . TESTICULAR HYPOFUNCTION 10/01/2009  . DERMATITIS DUE TO SOLAR RADIATION 10/01/2009  . ERECTILE DYSFUNCTION, MILD 06/26/2007  . HYPERTRIGLYCERIDEMIA 06/21/2007  . Allergic rhinitis 06/21/2007  . 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 CHOLECYSTECTOMY  2007       Home Medications    Prior to Admission medications     Medication Sig Start Date End Date Taking? Authorizing Provider  AXIRON 30 MG/ACT SOLN Place 1 application onto the skin daily.    Yes Historical Provider, MD  doxycycline (VIBRAMYCIN) 100 MG capsule Take 100 mg by mouth daily.   Yes Historical Provider, MD  fluticasone (FLONASE) 50 MCG/ACT nasal spray Place 2 sprays into both nostrils daily as needed for rhinitis.   Yes Historical Provider, MD  loratadine (CLARITIN) 10 MG tablet Take 10 mg by mouth daily as needed for allergies.    Yes Historical Provider, MD  Multiple Vitamin (MULTIVITAMIN WITH MINERALS) TABS tablet Take 1 tablet by mouth daily.   Yes Historical Provider, MD  omega-3 acid ethyl esters (LOVAZA) 1 g capsule Take 1 g by mouth daily.   Yes Historical Provider, MD  simvastatin (ZOCOR) 10 MG tablet TAKE 1 TABLET (10 MG TOTAL) BY MOUTH AT BEDTIME. 02/21/16  Yes Dorena Cookey, MD  tadalafil (CIALIS) 20 MG tablet Take 10-20 mg by mouth daily as needed for erectile dysfunction.   Yes Historical Provider, MD    Family History Family History  Problem Relation Age of Onset  . Colon cancer Neg Hx     Social History Social History  Substance Use Topics  . Smoking status: Never Smoker  . Smokeless tobacco: Never Used  . Alcohol use Yes     Comment: occ     Allergies   Patient has no known allergies.   Review of Systems  Review of Systems  Constitutional: Negative for chills, fatigue and fever.  HENT: Negative for congestion and rhinorrhea.   Eyes: Negative for visual disturbance.  Respiratory: Negative for cough, shortness of breath and wheezing.   Cardiovascular: Negative for chest pain and leg swelling.  Gastrointestinal: Negative for abdominal pain, diarrhea, nausea and vomiting.  Genitourinary: Negative for dysuria and flank pain.  Musculoskeletal: Negative for neck pain and neck stiffness.  Skin: Negative for rash and wound.  Allergic/Immunologic: Negative for immunocompromised state.  Neurological: Positive for  numbness. Negative for syncope, weakness and headaches.  All other systems reviewed and are negative.    Physical Exam Updated Vital Signs BP 173/93 (BP Location: Right Arm)   Pulse 62   Temp 98.1 F (36.7 C)   Resp 14   Ht 5\' 11"  (1.803 m)   Wt 185 lb (83.9 kg)   SpO2 100%   BMI 25.80 kg/m   Physical Exam  Constitutional: He is oriented to person, place, and time. He appears well-developed and well-nourished. No distress.  HENT:  Head: Normocephalic and atraumatic.  Eyes: Conjunctivae are normal.  Neck: Neck supple.  Cardiovascular: Normal rate, regular rhythm and normal heart sounds.  Exam reveals no friction rub.   No murmur heard. Pulmonary/Chest: Effort normal and breath sounds normal. No respiratory distress. He has no wheezes. He has no rales.  Abdominal: He exhibits no distension.  Musculoskeletal: He exhibits no edema.  Neurological: He is alert and oriented to person, place, and time. He exhibits normal muscle tone.  Skin: Skin is warm. Capillary refill takes less than 2 seconds.  Psychiatric: He has a normal mood and affect.  Nursing note and vitals reviewed.   Neurological Exam:  Mental Status: Alert and oriented to person, place, and time. Attention and concentration normal. Speech clear. Recent memory is intact. Cranial Nerves: Visual fields grossly intact. EOMI and PERRLA. No nystagmus noted. Facial sensation diminished to light touch in left hemiface and throughout LUE. No facial asymmetry or weakness. Hearing grossly normal. Uvula is midline, and palate elevates symmetrically. Normal SCM and trapezius strength. Tongue midline without fasciculations. Motor: Muscle strength 5/5 in proximal and distal UE and LE bilaterally. No pronator drift. Muscle tone normal. Reflexes: 2+ and symmetrical in all four extremities.  Sensation: Intact to light touch in upper and lower extremities distally bilaterally. But subjectively diminished throughout LUE Gait: Normal  without ataxia. Coordination: Normal FTN bilaterally.    ED Treatments / Results  Labs (all labs ordered are listed, but only abnormal results are displayed) Labs Reviewed  CBG MONITORING, ED - Abnormal; Notable for the following:       Result Value   Glucose-Capillary 119 (*)    All other components within normal limits  PROTIME-INR  APTT  CBC  DIFFERENTIAL  COMPREHENSIVE METABOLIC PANEL  I-STAT TROPOININ, ED  I-STAT CHEM 8, ED    EKG  EKG Interpretation  Date/Time:  Thursday December 07 2016 10:49:06 EST Ventricular Rate:  66 PR Interval:    QRS Duration: 100 QT Interval:  373 QTC Calculation: 391 R Axis:   20 Text Interpretation:  Sinus rhythm No significant change since last tracing Confirmed by Whittany Parish MD, Lysbeth Galas [phone]) on 12/07/2016 11:32:06 AM       Radiology Ct Head Wo Contrast  Result Date: 12/07/2016 CLINICAL DATA:  Left-sided weakness and facial numbness EXAM: CT HEAD WITHOUT CONTRAST TECHNIQUE: Contiguous axial images were obtained from the base of the skull through the vertex without intravenous contrast. COMPARISON:  None  FINDINGS: Brain: No evidence of acute infarction, hemorrhage, hydrocephalus, extra-axial collection or mass lesion/mass effect. Mild low-attenuation within the subcortical white matter of the frontal lobes identified compatible with chronic microvascular disease. Vascular: No hyperdense vessel or unexpected calcification. Skull: Normal. Negative for fracture or focal lesion. Sinuses/Orbits: No acute finding. Other: None. IMPRESSION: 1. No acute intracranial abnormality. Electronically Signed   By: Kerby Moors M.D.   On: 12/07/2016 11:24    Procedures Procedures (including critical care time)  Medications Ordered in ED Medications - No data to display   Initial Impression / Assessment and Plan / ED Course  I have reviewed the triage vital signs and the nursing notes.  Pertinent labs & imaging results that were available during my  care of the patient were reviewed by me and considered in my medical decision making (see chart for details).     66 year old male here with acute onset left face and arm numbness. No other focal neurological deficits. On arrival, vital signs are stable with exception of hypertension. Concern for stuttering TIAs versus acute CVA. CT head obtained and shows no evidence of bleed on my preliminary review. Code stroke activated on arrival and patient will be transferred to [facility] for [facility] evaluation. Patient accepted by Dr. Sherry Ruffing and I discussed with Dr. Shon Hale.  Final Clinical Impressions(s) / ED Diagnoses   Final diagnoses:  Numbness and tingling of left side of face  Left arm numbness      Duffy Bruce, MD 12/07/16 [id number]

## 2016-12-07 NOTE — H&P (Signed)
History and Physical    Jerry [name] KTG:[id number] DOB: 07-Oct-1951 DOA: 12/07/2016   PCP: Joycelyn Man, MD   Patient coming from/Resides with: Private residence/lives alone  Admission status: Observation/telemetry -it may be medically necessary to stay a minimum 2 midnights to rule out impending and/or unexpected changes in physiologic status that may differ from initial evaluation performed in the ER and/or at time of admission therefore consider reevaluation of admission status in 24 hours.   Chief Complaint: Left facial and arm numbness  HPI: Jerry [name] is a 66 y.o. male with medical history significant for dyslipidemia, low testosterone and elevated blood pressure without a diagnosis of hypertension. Patient reported while [place] from the beach Monday he developed left facial and arm numbness that lasted about 45 minutes. Yesterday evening on 2/21 he had additional waxing and waning symptoms of transient duration. Symptoms began again this morning around 945 and may have been associated with some left arm weakness as well. Initially presented to [facility] ER but giving strokelike symptoms he was subsequently transferred to Jerry [name] is a code stroke. Upon arrival he was met by neurology. His initial CT of the head was unremarkable at [facility]. Since arrival to calm symptoms have now resolved although he is still reporting left paranasal labial fold tingling. In addition to the above symptoms yesterday evening when symptoms began he felt like he was gone a fall without any definitive weakness although he reported generalized weakness. He has not had any visual disturbances, no difficulty with speaking. No difficulty walking, holding objects or dragging his feet with ambulation.  ED Course:  Vital Signs: BP 150/86   Pulse 62   Temp 98.1 F (36.7 C)   Resp 15   Ht 5' 11"  (1.803 m)   Wt 83.9 kg (185 lb)   SpO2 100%   BMI 25.80 kg/m  CT had without contrast:  Negative for acute intracranial process Lab data: Sodium 137, potassium 4.6, chloride 107, CO2 26, glucose 110, BUN 23, creatinine 1.03, LFTs normal, poc troponin 0.00, white count 5200 with normal differential, hemoglobin 15.9, platelets 268,000, coags normal Medications and treatments: None  Review of Systems:  In addition to the HPI above,  No Fever-chills, myalgias or other constitutional symptoms No Headache, changes with Vision or hearing, dizziness, dysarthria or word finding difficulty, gait disturbance or imbalance, tremors or seizure activity No problems swallowing food or Liquids, indigestion/reflux, choking or coughing while eating, abdominal pain with or after eating No Chest pain, Cough or Shortness of Breath, palpitations, orthopnea or DOE No Abdominal pain, N/V, melena,hematochezia, dark tarry stools, constipation No dysuria, malodorous urine, hematuria or flank pain No new skin rashes, lesions, masses or bruises, No new joint pains, aches, swelling or redness No recent unintentional weight gain or loss No polyuria, polydypsia or polyphagia   Past Medical History:  Diagnosis Date  . Allergy   . Hyperlipidemia   . Hypogonadism male     Past Surgical History:  Procedure Laterality Date  . CHOLECYSTECTOMY  2007    Social History   Social History  . Marital status: Married    Spouse name: N/A  . Number of children: N/A  . Years of education: N/A   Occupational History  . Not on file.   Social History Main Topics  . Smoking status: Never Smoker  . Smokeless tobacco: Never Used  . Alcohol use Yes     Comment: occ  . Drug use: No  . Sexual activity: Not  on file   Other Topics Concern  . Not on file   Social History Narrative  . 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 Colon cancer Neg Hx       Prior to Admission medications   Medication Sig Start Date End Date Taking? Authorizing Provider  AXIRON 30 MG/ACT SOLN Place 1 application onto the skin daily.    Yes Historical Provider, MD  doxycycline (VIBRAMYCIN) 100 MG capsule Take 100 mg by mouth daily.   Yes Historical Provider, MD  fluticasone (FLONASE) 50 MCG/ACT nasal spray Place 2 sprays into both nostrils daily as needed for rhinitis.   Yes Historical Provider, MD  loratadine (CLARITIN) 10 MG tablet Take 10 mg by mouth daily as needed for allergies.    Yes Historical Provider, MD  Multiple Vitamin (MULTIVITAMIN WITH MINERALS) TABS tablet Take 1 tablet by mouth daily.   Yes Historical Provider, MD  omega-3 acid ethyl esters (LOVAZA) 1 g capsule Take 1 g by mouth daily.   Yes Historical Provider, MD  simvastatin (ZOCOR) 10 MG tablet TAKE 1 TABLET (10 MG TOTAL) BY MOUTH AT BEDTIME. 02/21/16  Yes Dorena Cookey, MD  tadalafil (CIALIS) 20 MG tablet Take 10-20 mg by mouth daily as needed for erectile dysfunction.   Yes Historical Provider, MD    Physical Exam: Vitals:   12/07/16 1051 12/07/16 1123 12/07/16 1145  BP: 173/93  150/86  Pulse: 62  62  Resp: 14  15  Temp: 98.1 F (36.7 C) 98.1 F (36.7 C)   TempSrc: Oral    SpO2: 100%  100%  Weight: 83.9 kg (185 lb)    Height: 5' 11"  (1.803 m)        Constitutional: NAD, calm, comfortable Eyes: PERRL, lids and conjunctivae normal ENMT: Mucous membranes are moist. Posterior pharynx clear of any exudate or lesions.Normal dentition.  Neck: normal, supple, no masses, no thyromegaly Respiratory: clear to auscultation bilaterally, no wheezing, no crackles. Normal respiratory effort. No accessory muscle use.  Cardiovascular: Regular rate and rhythm, no murmurs / rubs / gallops. No extremity edema. 2+ pedal pulses. No carotid bruits.  Abdomen: no tenderness, no masses palpated. No hepatosplenomegaly. Bowel sounds positive.  Musculoskeletal: no clubbing / cyanosis. No joint  deformity upper and lower extremities. Good ROM, no contractures. Normal muscle tone.  Skin: no rashes, lesions, ulcers. No induration Neurologic: CN 2-12 grossly intact. Sensation intact, DTR normal. Strength 5/5 x all 4 extremities.  Psychiatric: Normal judgment and insight. Alert and oriented x 3. Normal mood.    Labs on Admission: I have personally reviewed following labs and imaging studies  CBC:  Recent Labs Lab 12/07/16 1118 12/07/16 1129  WBC 5.2  --   NEUTROABS 3.3  --   HGB 15.9 15.6  HCT 43.6 46.0  MCV 85.5  --   PLT 268  --    Basic Metabolic Panel:  Recent Labs Lab 12/07/16 1118 12/07/16 1129  NA 137 138  K 4.6 4.5  CL 107 104  CO2 26  --   GLUCOSE 110* 105*  BUN 23* 25*  CREATININE 1.03 1.00  CALCIUM 9.6  --    GFR: Estimated Creatinine Clearance: 79.5 mL/min (by C-G formula based on SCr of 1 mg/dL). Liver Function Tests:  Recent Labs Lab 12/07/16 1118  AST 31  ALT 40  ALKPHOS 73  BILITOT 1.0  PROT 8.1  ALBUMIN 4.8   No results for input(s): LIPASE, AMYLASE in the last 168 hours. No results for input(s): AMMONIA in the last 168 hours. Coagulation Profile:  Recent Labs Lab 12/07/16 1118  INR 1.00   Cardiac Enzymes: No results for input(s): CKTOTAL, CKMB, CKMBINDEX, TROPONINI in the last 168 hours. BNP (last 3 results) No results for input(s): PROBNP in the last 8760 hours. HbA1C: No results for input(s): HGBA1C in the last 72 hours. CBG:  Recent Labs Lab 12/07/16 1123  GLUCAP 119*   Lipid Profile: No results for input(s): CHOL, HDL, LDLCALC, TRIG, CHOLHDL, LDLDIRECT in the last 72 hours. Thyroid Function Tests: No results for input(s): TSH, T4TOTAL, FREET4, T3FREE, THYROIDAB in the last 72 hours. Anemia Panel: No results for input(s): VITAMINB12, FOLATE, FERRITIN, TIBC, IRON, RETICCTPCT in the last 72 hours. Urine analysis:    Component Value Date/Time   COLORURINE yellow 09/22/2008 0951   APPEARANCEUR Clear 09/22/2008  0951   LABSPEC >=1.030 09/22/2008 0951   PHURINE 5.5 09/22/2008 0951   HGBUR negative 09/22/2008 0951   BILIRUBINUR n 02/15/2016 1046   PROTEINUR trace 02/15/2016 1046   UROBILINOGEN 0.2 02/15/2016 1046   UROBILINOGEN 0.2 09/22/2008 0951   NITRITE n 02/15/2016 1046   NITRITE negative 09/22/2008 0951   LEUKOCYTESUR Negative 02/15/2016 1046   Sepsis Labs: @LABRCNTIP (procalcitonin:4,lacticidven:4) )No results found for this or any previous visit (from the past 240 hour(s)).   Radiological Exams on Admission: Ct Head Wo Contrast  Result Date: 12/07/2016 CLINICAL DATA:  Left-sided weakness and facial numbness EXAM: CT HEAD WITHOUT CONTRAST TECHNIQUE: Contiguous axial images were obtained from the base of the skull through the vertex without intravenous contrast. COMPARISON:  None FINDINGS: Brain: No evidence of acute infarction, hemorrhage, hydrocephalus, extra-axial collection or mass lesion/mass effect. Mild low-attenuation within the subcortical white matter of the frontal lobes identified compatible with chronic microvascular disease. Vascular: No hyperdense vessel or unexpected calcification. Skull: Normal. Negative for fracture or focal lesion. Sinuses/Orbits: No acute finding. Other: None. IMPRESSION: 1. No acute intracranial abnormality. Electronically Signed   By: Kerby Moors M.D.   On: 12/07/2016 11:24    EKG: (Independently reviewed) Sinus rhythm with ventricular rate 66 bpm, QTC 391 ms, peaked T waves without any definitive ischemic changes  Assessment/Plan Principal Problem:   TIA (transient ischemic attack) -Presents with recurrent issues related to left facial and arm numbness and tingling and possible weakness-day with second episode this week -Initial CT head negative; CTA head and neck pending -Neurology consulted -Ischemic stroke order set initiated -MRI/MRA brain -CTA head and neck no indication to pursue carotid duplex -Echocardiogram -Hemoglobin A1c/lipid  panel -PT/OT/SLP evaluation -Antiplatelet with aspirin -Frequent neurological checks  Active Problems:   Elevated blood pressure reading without diagnosis of hypertension -Blood pressure readings and hypertensive range -If additional workup reveals acute stroke will need to allow for permissive hypertension otherwise may need to consider the addition of antihypertensive agents during this admission    Low testosterone in male -Continue topical Axiron    HYPERTRIGLYCERIDEMIA -Continue preadmission Zocor and Lovaza      DVT prophylaxis: Lovenox Code Status: Full Family Communication:  No family at bedside Disposition Plan: Discharge back to preadmission home environment Consults called: Neurology/Oster    Samella Parr ANP-BC [facility] Pager [phone]   If 7PM-7AM, please contact night-coverage [email or web address] Password [facility]  12/07/2016, 12:40 PM

## 2016-12-07 NOTE — Consult Note (Signed)
Neurology Consult Note  Reason for Consultation: CODE STROKE  Requesting provider: Duffy Bruce, MD  CC: Left face, arm tingling  HPI: This is a 6-yo RH man who presented to [facility] ED this morning for the evaluation of numbness of the L face and arm. History is obtained directly from the patient who is an excellent historian.   On 12/04/16, he had an episode of numbness and tingling that started in the L face and arm while he was driving. This lasted about 45 minutes and then completely resolved. He thought little about it. However, he had another episode of similar symptoms yesterday while he was out walking, this time accompanied by a feeling of dizziness, warmth, and sweating. Symptoms again resolved in less than one hour.   He reports that while he was driving to work this morning, he experienced a severe headache described as intense pain in a quarter-size area over the back of his head. No other symptoms were reported at the time and this pain resolved after a few minutes. Once he got to his office, he called his PCP to discuss the episodes he had earlier in the week. While he was on the phone, he once again developed tingling that started in his left face and spread to involve the left arm, this time with some mild weakness of the left hand that was new for him. This episode was also accompanied by feeling hot, sweaty, and dizzy. He had a friend drive him to the [facility] ED where he was noted to have decreased light touch over the left face and arm. CODE STROKE was activated and he was transferred to [facility] for evaluation. CTH was obtained prior to transfer and showed no acute abnormality.   I evaluated the patient after his arrival at [facility]. He reports that his symptoms have improved but he still has some slight tingling of the L cheek. On exam, he was noted to have decreased light touch and pinprick over the left face and arm as well as a peripheral neuropathy involving both  LEs. He was not felt to be a candidate for tPA given minimal deficits. However, his report of recurrent symptoms raised concern for possible high-grade carotid lesion so STAT CTA of the head and neck were ordered.   Last known well: 0945 NHISS score: 1 tPA given?: No   PMH:  Past Medical History:  Diagnosis Date  . Allergy   . Hyperlipidemia   . Hypogonadism male     PSH:  Past Surgical History:  Procedure Laterality Date  . CHOLECYSTECTOMY  2007    Family history: Family History  Problem Relation Age of Onset  . 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 He has a remote history of tobacco use more than 40 years, nothing more recent. No other tobacco products. He drinks beer and wine on weekends, up to 6-8 beers and a small bottle of wine. No illicit drug use.    Current outpatient meds: Current Meds  Medication Sig  . AXIRON 30 MG/ACT SOLN Place 1 application onto the skin daily.   Marland Kitchen doxycycline (VIBRAMYCIN) 100 MG capsule Take 100 mg by mouth daily.  . fluticasone (FLONASE) 50 MCG/ACT nasal spray Place 2 sprays into both nostrils daily as needed for rhinitis.  Marland Kitchen loratadine (CLARITIN) 10 MG tablet Take 10 mg by mouth daily as needed for allergies.   Marland Kitchen  Multiple Vitamin (MULTIVITAMIN WITH MINERALS) TABS tablet Take 1 tablet by mouth daily.  Marland Kitchen omega-3 acid ethyl esters (LOVAZA) 1 g capsule Take 1 g by mouth daily.  . simvastatin (ZOCOR) 10 MG tablet TAKE 1 TABLET (10 MG TOTAL) BY MOUTH AT BEDTIME.  . tadalafil (CIALIS) 20 MG tablet Take 10-20 mg by mouth daily as needed for erectile dysfunction.    Current inpatient meds:  No current facility-administered medications for this encounter.    Current Outpatient Prescriptions  Medication Sig Dispense Refill  . AXIRON 30 MG/ACT SOLN Place 1 application onto the skin daily.     Marland Kitchen doxycycline (VIBRAMYCIN) 100 MG capsule Take 100 mg by mouth daily.    .  fluticasone (FLONASE) 50 MCG/ACT nasal spray Place 2 sprays into both nostrils daily as needed for rhinitis.    Marland Kitchen loratadine (CLARITIN) 10 MG tablet Take 10 mg by mouth daily as needed for allergies.     . Multiple Vitamin (MULTIVITAMIN WITH MINERALS) TABS tablet Take 1 tablet by mouth daily.    Marland Kitchen omega-3 acid ethyl esters (LOVAZA) 1 g capsule Take 1 g by mouth daily.    . simvastatin (ZOCOR) 10 MG tablet TAKE 1 TABLET (10 MG TOTAL) BY MOUTH AT BEDTIME. 100 tablet 0  . tadalafil (CIALIS) 20 MG tablet Take 10-20 mg by mouth daily as needed for erectile dysfunction.      Allergies: No Known Allergies  ROS: As per HPI. A full 14-point review of systems was performed and is otherwise unremarkable.   PE:  BP 150/86   Pulse 62   Temp 98.1 F (36.7 C)   Resp 15   Ht 5\' 11"  (1.803 m)   Wt 83.9 kg (185 lb)   SpO2 100%   BMI 25.80 kg/m   General: WDWN, no acute distress. AAO x4. Speech clear, no dysarthria. No aphasia. Follows commands briskly. Affect is bright with congruent mood. Comportment is normal.  HEENT: Normocephalic. Neck supple without LAD. MMM, OP clear. Dentition good. Sclerae anicteric. No conjunctival injection.  CV: Regular, no murmur. Carotid pulses full and symmetric, no bruits. Distal pulses 2+ and symmetric.  Lungs: CTAB.  Abdomen: Soft, non-distended, non-tender. Bowel sounds present x4.  Extremities: No C/C/E. Neuro:  CN: Pupils are equal and round. They are symmetrically reactive from 3-->2 mm. EOMI without nystagmus. No reported diplopia. Facial sensation is decreased to light touch and pinprick on the left. Face is symmetric at rest with normal strength and mobility. Hearing is intact to conversational voice. Palate elevates symmetrically and uvula is midline. Voice is normal in tone, pitch and quality. Bilateral SCM and trapezii are 5/5. Tongue is midline with normal bulk and mobility.  Motor: Normal bulk, tone, and strength. No tremor or other abnormal movements. No  drift.  Sensation: Decreased to light touch and pinprick in the left arm. Pinprick is reduced on BLE in a stocking distribution. Vibration in mildly reduced in both great toes.  Joint position in normal.  DTRs: 2+, symmetric. Toes downgoing bilaterally. No pathologic reflexes.  Coordination: Finger-to-nose and heel-to-shin are without dysmetria. Finger taps are normal in amplitude and speed, no decrement.    Labs:  Lab Results  Component Value Date   WBC 5.2 12/07/2016   HGB 15.6 12/07/2016   HCT 46.0 12/07/2016   PLT 268 12/07/2016   GLUCOSE 105 (H) 12/07/2016   CHOL 208 (H) 02/15/2016   TRIG 212.0 (H) 02/15/2016   HDL 43.40 02/15/2016   LDLDIRECT 125.0 02/15/2016   LDLCALC  109 (H) 02/01/2015   ALT 40 12/07/2016   AST 31 12/07/2016   NA 138 12/07/2016   K 4.5 12/07/2016   CL 104 12/07/2016   CREATININE 1.00 12/07/2016   BUN 25 (H) 12/07/2016   CO2 26 12/07/2016   TSH 4.89 (H) 02/15/2016   PSA 1.50 02/15/2016   INR 1.00 12/07/2016   HGBA1C 5.2 06/15/2015    Imaging:  I have personally and independently reviewed the [facility] without contrast from today. This is unremarkable.   CTA head and neck pending.   Assessment and Plan:  1. Acute Ischemic Stroke: Presentation is concerning for an acute stroke involving the L MCA territory with stuttering symptoms leading up to today's presentation. Known risk factors for cerebrovascular disease in this patient include dyslipidemia. STAT CTA head and neck ordered to evaluate for carotid stenosis. Additional workup will be ordered to include MRI brain, TTE, fasting lipids, and hemoglobin a1c. Further testing will be determined by results from these initial studies. Recommend antiplatelet therapy with aspirin 81 mg daily for secondary stroke prevention once cleared to take oral medications. Continue statin with goal LDL less than 70. Ensure adequate glucose control. Allow permissive hypertension in the acute phase, treating only SBP greater than  220 mmHg and/or DBP greater than 110 mmHg. Avoid fever and hyperglycemia as these can extend the infarct. Initiate rehab services as needed. DVT prophylaxis as needed.   2. Left sided numbness: This is acute, due to probable stroke as above. Minor at this time, no intervention.   3. Left hand weakness: This has resolved, likely due to stroke as above.   This was discussed at length with the patient at length. He is in agreement with the plan as noted. He was given the chance to ask any questions and these were addressed to his satisfaction.   I also discussed my impression and recommendations with the ED MD Dr. Sherry Ruffing.   Thank you for this consult. The stroke team will assume care of the patient beginning 12/08/16.

## 2016-12-07 NOTE — Telephone Encounter (Signed)
Pt needs appt with [facility] ASAP.

## 2016-12-07 NOTE — Progress Notes (Signed)
  Echocardiogram 2D Echocardiogram has been performed.  Jerry [name] 12/07/2016, 4:27 PM

## 2016-12-07 NOTE — Telephone Encounter (Signed)
Patient Name: Jerry [name] DOB: [birth year] Initial Comment Caller states left side of face went numb for 45 mins on Monday. Happened again yesterday for 30 mins. Both times left arm started to go numb as well. This morning had strong pain on right side of back of head near bottom of skull. Went away but is coming back again. Currently having tingling in arm and hand as well as slight facial numbness. Nurse Assessment Nurse: Andria Frames, RN, [place] Date/Time (Eastern Time): 12/07/2016 10:19:47 AM Confirm and document reason for call. If symptomatic, describe symptoms. ---Caller states left side of face went numb for 45 mins on Monday. Happened again yesterday for 30 mins. Both times left arm started to go numb as well. This morning had strong pain on right side of back of head near bottom of skull. Went away but is coming back again. Currently having tingling in arm and hand as well as slight facial numbness. Does the patient have any new or worsening symptoms? ---Yes Will a triage be completed? ---Yes Related visit to physician within the last 2 weeks? ---No Does the PT have any chronic conditions? (i.e. diabetes, asthma, etc.) ---Yes List chronic conditions. ---simvastatin Is this a behavioral health or substance abuse call? ---No Guidelines Guideline Title Affirmed Question Affirmed Notes Neurologic Deficit [1] Numbness (i.e., loss of sensation) of the face, arm / hand, or leg / foot on one side of the body AND [2] sudden onset AND [3] present now Final Disposition User Call EMS 911 Now Hensel, RN, [place] - ED Disagree/Comply: Disagree Disagree/Comply Reason: Disagree with instructions

## 2016-12-07 NOTE — ED Notes (Signed)
Attempted report x1. 

## 2016-12-07 NOTE — Telephone Encounter (Signed)
Pt called to let you know he has been admitted to [facility].

## 2016-12-07 NOTE — ED Notes (Signed)
Charge RN in [place] ED made aware that pt is coming.

## 2016-12-07 NOTE — ED Provider Notes (Signed)
Patient transferred from [facility] for further management of code stroke.  According to EMS and patient, last normal was at 9:45 AM. Patient had left face and left arm numbness that is ongoing. He reports improved left arm weakness. He is right-handed.  Shortly after arrival, neurology came to the bedside. They recommended admission for further workup of possible stroke.  Hospitalist team called and patient was admitted without complication. Neurology placed further imaging orders and patient was admitted in stable condition.    Clinical Impression: 1. Numbness and tingling of left side of face   2. Left arm numbness   3. Acute ischemic stroke ([place])   4. Acute ischemic stroke ([place])   5. TIA (transient ischemic attack)   6. TIA (transient ischemic attack)     Disposition: Admit to Hospitalist service    Courtney Paris, MD 12/07/16 2006

## 2016-12-07 NOTE — ED Triage Notes (Signed)
Patient reports that Monday evening driving home from [place] he had left facial and arm numbness that lasted about 45 mins.  Patient states that happened again last night for about 30 mins.  Patient reports that started again this am around 945am having left facial numbness and left arm numbness. Patient has pain in posterior head. Patient has equal grips, facial symmetrical.  Patient has less sensation on right hand and left leg.

## 2016-12-07 NOTE — ED Notes (Signed)
Neuro at bedside.

## 2016-12-07 NOTE — ED Notes (Signed)
Pt returned from ct, ambulated to the restroom and placed back on monitor, will continue to monitor

## 2016-12-07 NOTE — ED Notes (Signed)
Code stroke called by Dr Ellender Hose,

## 2016-12-07 NOTE — Telephone Encounter (Signed)
LMTCB

## 2016-12-07 NOTE — Progress Notes (Signed)
Patient arrived from ED to [phone]. Safety precautions and orders reviewed with pt. TELE applied and confirmed. VSS. Attending MD paged. Will continue to monitor.  Ave Filter, RN

## 2016-12-07 NOTE — ED Notes (Signed)
Pt received from [facility] from [facility], the patient reports having numbness in his left arm and the left side of his face that started at 0945 that is still there, alert and oriented, denies any pain, the symptoms were also there on Monday and Thursday for 45 min. Neurology paged. Will continue to monitor patient

## 2016-12-08 ENCOUNTER — Encounter (HOSPITAL_COMMUNITY): Payer: BLUE CROSS/BLUE SHIELD

## 2016-12-08 DIAGNOSIS — G458 Other transient cerebral ischemic attacks and related syndromes: Secondary | ICD-10-CM | POA: Diagnosis not present

## 2016-12-08 DIAGNOSIS — I639 Cerebral infarction, unspecified: Secondary | ICD-10-CM | POA: Diagnosis not present

## 2016-12-08 LAB — LIPID PANEL
CHOL/HDL RATIO: 5.4 ratio
Cholesterol: 178 mg/dL (ref 0–200)
HDL: 33 mg/dL — ABNORMAL LOW (ref >40)
LDL CALC: 97 mg/dL (ref 0–99)
Triglycerides: 240 mg/dL — ABNORMAL HIGH (ref <150)
VLDL: 48 mg/dL — AB (ref 0–40)

## 2016-12-08 LAB — HIV ANTIBODY (ROUTINE TESTING W REFLEX): HIV Screen 4th Generation wRfx: NONREACTIVE

## 2016-12-08 MED ORDER — ASPIRIN 325 MG PO TABS: 325.0000 mg | ORAL_TABLET | Freq: Every day | ORAL | 0 refills | Status: DC

## 2016-12-08 MED ORDER — SIMVASTATIN 20 MG PO TABS: 20.0000 mg | ORAL_TABLET | Freq: Every day | ORAL | 0 refills | Status: DC

## 2016-12-08 MED ORDER — SIMVASTATIN 20 MG PO TABS
20.0000 mg | ORAL_TABLET | Freq: Every day | ORAL | Status: DC
Start: 2016-12-08 — End: 2016-12-08

## 2016-12-08 NOTE — Evaluation (Signed)
Occupational Therapy Evaluation and Discharge  Patient Details Name: Jerry [name] MRN: [id number] DOB: June 11, 1951 Today's Date: 12/08/2016    History of Present Illness 66 y.o. male with medical history significant for dyslipidemia, low testosterone and elevated blood pressure without a diagnosis of hypertension. Patient reported while driving [place] from the beach Monday he developed left facial and arm numbness that lasted about 45 minutes. MRI on 2/22 negative for acute abnormalities.   Clinical Impression   Pt reports he was independent with ADL and mobility PTA. Currently pt at his functional baseline and is independent with ADL and functional mobility. Pt reports all deficits have resolved at this time. Educated pt on signs/symptoms of CVA. Pt planning to d/c home alone with intermittent supervision from family/friends. No further acute OT needs identified; signing off at this time. Please re-consult if needs change. Thank you for this referral.    Follow Up Recommendations  No OT follow up    Equipment Recommendations  None recommended by OT    Recommendations for Other Services       Precautions / Restrictions Precautions Precautions: None Restrictions Weight Bearing Restrictions: No      Mobility Bed Mobility Overal bed mobility: Independent             General bed mobility comments: HOB flat without use of bed rails  Transfers Overall transfer level: Independent Equipment used: None                  Balance Overall balance assessment: No apparent balance deficits (not formally assessed)                                          ADL Overall ADL's : Independent                                       General ADL Comments: Pt able to complete ADL and functional mobility at independent level. No unsteadiness or LOB with activity. Educated pt on signs and symptoms of CVA; he verbalized understanding.     Vision  Baseline Vision/History: No visual deficits Patient Visual Report: No change from baseline Vision Assessment?: No apparent visual deficits     Perception     Praxis      Pertinent Vitals/Pain Pain Assessment: No/denies pain     Hand Dominance Right   Extremity/Trunk Assessment Upper Extremity Assessment Upper Extremity Assessment: Overall WFL for tasks assessed   Lower Extremity Assessment Lower Extremity Assessment: Defer to PT evaluation   Cervical / Trunk Assessment Cervical / Trunk Assessment: Normal   Communication Communication Communication: No difficulties   Cognition Arousal/Alertness: Awake/alert Behavior During Therapy: WFL for tasks assessed/performed Overall Cognitive Status: Within Functional Limits for tasks assessed                     General Comments       Exercises       Shoulder Instructions      Home Living Family/patient expects to be discharged to:: Private residence Living Arrangements: Alone Available Help at Discharge: Family;Available PRN/intermittently Type of Home: Apartment Home Access: Level entry     Home Layout: One level     Bathroom Shower/Tub: Tub/shower unit Shower/tub characteristics: Architectural technologist: Standard     Home Equipment: None  Prior Functioning/Environment Level of Independence: Independent        Comments: drives, works        OT Problem List:        OT Treatment/Interventions:      OT Goals(Current goals can be found in the care plan section) Acute Rehab OT Goals Patient Stated Goal: home today OT Goal Formulation: All assessment and education complete, DC therapy  OT Frequency:     Barriers to D/C:            Co-evaluation              End of Session Nurse Communication: Mobility status  Activity Tolerance: Patient tolerated treatment well Patient left: in chair;with call bell/phone within reach;with family/visitor present  OT Visit Diagnosis:  Muscle weakness (generalized) (M62.81)                ADL either performed or assessed with clinical judgement  Time: 0758-0808 OT Time Calculation (min): 10 min Charges:  OT General Charges $OT Visit: 1 Procedure OT Evaluation $OT Eval Low Complexity: 1 Procedure G-Codes: OT G-codes **NOT FOR INPATIENT CLASS** Functional Assessment Tool Used: AM-PAC 6 Clicks Daily Activity Functional Limitation: Self care Self Care Current Status [id number] 0 percent impaired, limited or restricted Self Care Goal Status [id number]): 0 percent impaired, limited or restricted Self Care Discharge Status [id number]): 0 percent impaired, limited or restricted   Mel Almond A. Ulice Brilliant, M.S., OTR/L Pager: [place] 12/08/2016, 8:13 AM

## 2016-12-08 NOTE — Discharge Summary (Signed)
Jerry [name] [id number] DOB: [birth year] DOA: 12/07/2016  PCP: Joycelyn Man, MD  Admit date: 12/07/2016  Discharge date: 12/08/2016  Admitted From: Home Disposition:  Home   Recommendations for Outpatient Follow-up:   Follow up with PCP in 1-2 weeks  PCP Please obtain BMP/CBC, 2 view CXR in 1week,  (see Discharge instructions)   PCP Please follow up on the following pending results: pending A1c, repeat lipid panel in 4 weeks   Home Health: None   Equipment/Devices: None  Consultations: Neuro Discharge Condition: Stable   CODE STATUS: Full   Diet Recommendation: Heart Healthy    Chief Complaint  Patient presents with  . Numbness    left side of face and arm   . Headache     Brief history of present illness from the day of admission and additional interim summary     Jerry [name] is a 66 y.o. male with medical history significant for dyslipidemia, low testosterone and elevated blood pressure without a diagnosis of hypertension, was admitted with left facial and left arm tingling numbness which happened 3 times last week.                                                                 Hospital Course     TIA (transient ischemic attack) - completely resolved, symptom free this morning, CT head, CT angiogram head and neck, MRI brain, echocardiogram all unremarkable, he is back to his baseline. Placed on aspirin, since LDL was greater than 70 his statin dose has been doubled, A1c is pending. Request PCP to repeat and check pending A1c results next visit and to repeat lipid panel in 4 weeks. Discussed case with neurologist Dr. Leonie Man cleared for home discharge. Currently no need for PT-OT or speech eval.   Low testosterone in male Continue topical Axiron   HYPERTRIGLYCERIDEMIA  with high LDL. Have  doubled Zocor dose continue Lovenox R, request PCP to check lipid panel in 4 weeks.   Discharge diagnosis     Principal Problem:   TIA (transient ischemic attack) Active Problems:   Low testosterone in male   HYPERTRIGLYCERIDEMIA   Elevated blood pressure reading without diagnosis of hypertension   Left arm numbness   Acute ischemic stroke [facility])    Discharge instructions    Discharge Instructions    Diet - low sodium heart healthy    Complete by:  As directed    Discharge instructions    Complete by:  As directed    Follow with Primary MD TODD,JEFFREY ALLEN, MD in 7 days   Get CBC, CMP, 2 view Chest X ray checked  by Primary MD or SNF MD in 5-7 days ( we routinely change or add medications that can affect your baseline labs and fluid status, therefore we recommend that  you get the mentioned basic workup next visit with your PCP, your PCP may decide not to get them or add new tests based on their clinical decision)  Activity: As tolerated with Full fall precautions use walker/cane & assistance as needed  Disposition Home    Diet: Heart Healthy   For Heart failure patients - Check your Weight same time everyday, if you gain over 2 pounds, or you develop in leg swelling, experience more shortness of breath or chest pain, call your Primary MD immediately. Follow Cardiac Low Salt Diet and 1.5 lit/day fluid restriction.  On your next visit with your primary care physician please Get Medicines reviewed and adjusted.  Please request your Prim.MD to go over all Hospital Tests and Procedure/Radiological results at the follow up, please get all Hospital records sent to your Prim MD by signing hospital release before you go home.  If you experience worsening of your admission symptoms, develop shortness of breath, life threatening emergency, suicidal or homicidal thoughts you must seek medical attention immediately by calling 911 or calling your MD immediately  if symptoms less  severe.  You Must read complete instructions/literature along with all the possible adverse reactions/side effects for all the Medicines you take and that have been prescribed to you. Take any new Medicines after you have completely understood and accpet all the possible adverse reactions/side effects.   Do not drive, operate heavy machinery, perform activities at heights, swimming or participation in water activities or provide baby sitting services if your were admitted for syncope or siezures until you have seen by Primary MD or a Neurologist and advised to do so again.  Do not drive when taking Pain medications.    Do not take more than prescribed Pain, Sleep and Anxiety Medications  Special Instructions: If you have smoked or chewed Tobacco  in the last 2 yrs please stop smoking, stop any regular Alcohol  and or any Recreational drug use.  Wear Seat belts while driving.   Please note  You were cared for by a hospitalist during your hospital stay. If you have any questions about your discharge medications or the care you received while you were in the hospital after you are discharged, you can call the unit and asked to speak with the hospitalist on call if the hospitalist that took care of you is not available. Once you are discharged, your primary care physician will handle any further medical issues. Please note that NO REFILLS for any discharge medications will be authorized once you are discharged, as it is imperative that you return to your primary care physician (or establish a relationship with a primary care physician if you do not have one) for your aftercare needs so that they can reassess your need for medications and monitor your lab values.   Increase activity slowly    Complete by:  As directed       Discharge Medications   Allergies as of 12/08/2016   No Known Allergies     Medication List    TAKE these medications   aspirin 325 MG tablet Take 1 tablet (325 mg  total) by mouth daily. Start taking on:  12/09/2016   AXIRON 30 MG/ACT Soln Generic drug:  Testosterone Place 1 application onto the skin daily.   doxycycline 100 MG capsule Commonly known as:  VIBRAMYCIN Take 100 mg by mouth daily.   fluticasone 50 MCG/ACT nasal spray Commonly known as:  FLONASE Place 2 sprays into both nostrils daily as needed  for rhinitis.   loratadine 10 MG tablet Commonly known as:  CLARITIN Take 10 mg by mouth daily as needed for allergies.   multivitamin with minerals Tabs tablet Take 1 tablet by mouth daily.   omega-3 acid ethyl esters 1 g capsule Commonly known as:  LOVAZA Take 1 g by mouth daily.   simvastatin 20 MG tablet Commonly known as:  ZOCOR Take 1 tablet (20 mg total) by mouth daily at 6 PM. What changed:  See the new instructions.   tadalafil 20 MG tablet Commonly known as:  CIALIS Take 10-20 mg by mouth daily as needed for erectile dysfunction.       Follow-up Information    TODD,JEFFREY ALLEN, MD. Schedule an appointment as soon as possible for a visit in 1 week(s).   Specialty:  Family Medicine Contact information: [place] [address] [phone]        SETHI,PRAMOD, MD. Schedule an appointment as soon as possible for a visit in 1 month(s).   Specialties:  Neurology, Radiology Contact information: [address] [place] [address] [phone]           Major procedures and Radiology Reports - PLEASE review detailed and final reports thoroughly  -      TTE  - Left ventricle: The cavity size was normal. Systolic function was normal. The estimated ejection fraction was in the range of 55%   to 60%. Wall motion was normal; there were no regional wall motion abnormalities. - Aortic valve: There was trivial regurgitation.  Impressions:   No cardiac source of emboli was indentified.   Ct Angio Head W Or Wo Contrast  Result Date: 12/07/2016 CLINICAL DATA:  66 year old  male with left face and arm numbness intermittently for 4 days. Initial encounter. EXAM: CT ANGIOGRAPHY HEAD AND NECK TECHNIQUE: Multidetector CT imaging of the head and neck was performed using the standard protocol during bolus administration of intravenous contrast. Multiplanar CT image reconstructions and MIPs were obtained to evaluate the vascular anatomy. Carotid stenosis measurements (when applicable) are obtained utilizing NASCET criteria, using the distal internal carotid diameter as the denominator. CONTRAST:  50 mL Isovue 370 COMPARISON:  Head CT without contrast 1107 hours today. FINDINGS: CTA NECK Skeleton: Chronic disc and endplate degeneration D34-534. No acute osseous abnormality identified. Visualized paranasal sinuses and mastoids are stable and well pneumatized. Upper chest: Negative lung apices. No superior mediastinal lymphadenopathy. Other neck: Negative thyroid, larynx, pharynx, parapharyngeal spaces, retropharyngeal space, sublingual space, submandibular glands and parotid glands. No cervical lymphadenopathy. Aortic arch: 3 vessel arch configuration. No significant arch atherosclerosis. Right carotid system: Negative. Left carotid system: Negative. Vertebral arteries: Normal proximal right subclavian artery and right vertebral artery origin. Mildly late entry of the right vertebral into the cervical transverse foramen. No right vertebral artery stenosis to the skullbase. Minimal soft plaque in the proximal left subclavian artery without stenosis. Normal left vertebral artery origin. No left vertebral artery stenosis to the skullbase. CTA HEAD Posterior circulation: Fairly codominant distal vertebral arteries without stenosis. Normal left PICA origin. Right AICA appears dominant. No basilar stenosis. Normal SCA and PCA origins. Posterior communicating arteries are diminutive or absent. Normal bilateral PCA branches. Anterior circulation: Both ICA siphons are patent. The left siphon is normal.  There is minimal calcified plaque in the right siphon without stenosis. Normal ophthalmic artery origins. Normal carotid termini. Normal MCA and ACA origins. Diminutive or absent anterior communicating artery. Normal bilateral ACA branches. Left MCA M1 segment, trifurcation, and  left MCA branches are within normal limits. Right MCA M1 segment, bifurcation, and right MCA branches are within normal limits. Venous sinuses: Patent. Anatomic variants: None. Delayed phase: No abnormal enhancement identified. Review of the MIP images confirms the above findings IMPRESSION: 1. Normal for age CTA Head and Neck. No significant atherosclerosis or stenosis. 2. CT appearance of the brain remains normal. Electronically Signed   By: Genevie Ann M.D.   On: 12/07/2016 13:22   Ct Head Wo Contrast  Result Date: 12/07/2016 CLINICAL DATA:  Left-sided weakness and facial numbness EXAM: CT HEAD WITHOUT CONTRAST TECHNIQUE: Contiguous axial images were obtained from the base of the skull through the vertex without intravenous contrast. COMPARISON:  None FINDINGS: Brain: No evidence of acute infarction, hemorrhage, hydrocephalus, extra-axial collection or mass lesion/mass effect. Mild low-attenuation within the subcortical white matter of the frontal lobes identified compatible with chronic microvascular disease. Vascular: No hyperdense vessel or unexpected calcification. Skull: Normal. Negative for fracture or focal lesion. Sinuses/Orbits: No acute finding. Other: None. IMPRESSION: 1. No acute intracranial abnormality. Electronically Signed   By: Kerby Moors M.D.   On: 12/07/2016 11:24   Ct Angio Neck W Or Wo Contrast  Result Date: 12/07/2016 CLINICAL DATA:  66 year old male with left face and arm numbness intermittently for 4 days. Initial encounter. EXAM: CT ANGIOGRAPHY HEAD AND NECK TECHNIQUE: Multidetector CT imaging of the head and neck was performed using the standard protocol during bolus administration of intravenous  contrast. Multiplanar CT image reconstructions and MIPs were obtained to evaluate the vascular anatomy. Carotid stenosis measurements (when applicable) are obtained utilizing NASCET criteria, using the distal internal carotid diameter as the denominator. CONTRAST:  50 mL Isovue 370 COMPARISON:  Head CT without contrast 1107 hours today. FINDINGS: CTA NECK Skeleton: Chronic disc and endplate degeneration D34-534. No acute osseous abnormality identified. Visualized paranasal sinuses and mastoids are stable and well pneumatized. Upper chest: Negative lung apices. No superior mediastinal lymphadenopathy. Other neck: Negative thyroid, larynx, pharynx, parapharyngeal spaces, retropharyngeal space, sublingual space, submandibular glands and parotid glands. No cervical lymphadenopathy. Aortic arch: 3 vessel arch configuration. No significant arch atherosclerosis. Right carotid system: Negative. Left carotid system: Negative. Vertebral arteries: Normal proximal right subclavian artery and right vertebral artery origin. Mildly late entry of the right vertebral into the cervical transverse foramen. No right vertebral artery stenosis to the skullbase. Minimal soft plaque in the proximal left subclavian artery without stenosis. Normal left vertebral artery origin. No left vertebral artery stenosis to the skullbase. CTA HEAD Posterior circulation: Fairly codominant distal vertebral arteries without stenosis. Normal left PICA origin. Right AICA appears dominant. No basilar stenosis. Normal SCA and PCA origins. Posterior communicating arteries are diminutive or absent. Normal bilateral PCA branches. Anterior circulation: Both ICA siphons are patent. The left siphon is normal. There is minimal calcified plaque in the right siphon without stenosis. Normal ophthalmic artery origins. Normal carotid termini. Normal MCA and ACA origins. Diminutive or absent anterior communicating artery. Normal bilateral ACA branches. Left MCA M1 segment,  trifurcation, and left MCA branches are within normal limits. Right MCA M1 segment, bifurcation, and right MCA branches are within normal limits. Venous sinuses: Patent. Anatomic variants: None. Delayed phase: No abnormal enhancement identified. Review of the MIP images confirms the above findings IMPRESSION: 1. Normal for age CTA Head and Neck. No significant atherosclerosis or stenosis. 2. CT appearance of the brain remains normal. Electronically Signed   By: Genevie Ann M.D.   On: 12/07/2016 13:22   Mr Brain Wo Contrast  Result Date: 12/07/2016 CLINICAL DATA:  66 y/o  M; left arm weakness. EXAM: MRI HEAD WITHOUT CONTRAST TECHNIQUE: Multiplanar, multiecho pulse sequences of the brain and surrounding structures were obtained without intravenous contrast. COMPARISON:  CT of the head and CT angiogram head and neck dated 12/07/2016. FINDINGS: Brain: No acute infarction, hemorrhage, hydrocephalus, extra-axial collection or mass lesion. Few punctate nonspecific foci of T2 FLAIR hyperintensity in subcortical white matter are compatible with minimal chronic microvascular ischemic changes. Vascular: Normal flow voids. Skull and upper cervical spine: Normal marrow signal. Sinuses/Orbits: Mild right anterior ethmoid sinus mucosal thickening. Otherwise no abnormal signal of the visualized paranasal sinuses or mastoid air cells. Orbits are unremarkable. Other: None. IMPRESSION: 1. No acute intracranial abnormality. 2. Minimal chronic microvascular ischemic changes of white matter. 3. Mild right anterior ethmoid sinus mucosal thickening. Electronically Signed   By: Kristine Garbe M.D.   On: 12/07/2016 18:42    Micro Results     No results found for this or any previous visit (from the past 240 hour(s)).  Today   Subjective    Kel Amadon today has no headache,no chest abdominal pain,no new weakness tingling or numbness, feels much better wants to go home today.     Objective   Blood pressure 123/75,  pulse 75, temperature 98 F (36.7 C), temperature source Oral, resp. rate 18, height 5\' 11"  (1.803 m), weight 83.9 kg (185 lb), SpO2 99 %.   Intake/Output Summary (Last 24 hours) at 12/08/16 1014 Last data filed at 12/07/16 1721  Gross per 24 hour  Intake              720 ml  Output                0 ml  Net              720 ml    Exam Awake Alert, Oriented x 3, No new F.N deficits, Normal affect [place].AT,PERRAL Supple Neck,No JVD, No cervical lymphadenopathy appriciated.  Symmetrical Chest wall movement, Good air movement bilaterally, CTAB RRR,No Gallops,Rubs or new Murmurs, No Parasternal Heave +ve B.Sounds, Abd Soft, Non tender, No organomegaly appriciated, No rebound -guarding or rigidity. No Cyanosis, Clubbing or edema, No new Rash or bruise   Data Review   CBC w Diff: Lab Results  Component Value Date   WBC 5.2 12/07/2016   HGB 15.6 12/07/2016   HCT 46.0 12/07/2016   PLT 268 12/07/2016   LYMPHOPCT 21 12/07/2016   MONOPCT 13 12/07/2016   EOSPCT 2 12/07/2016   BASOPCT 1 12/07/2016    CMP: Lab Results  Component Value Date   NA 138 12/07/2016   K 4.5 12/07/2016   CL 104 12/07/2016   CO2 26 12/07/2016   BUN 25 (H) 12/07/2016   CREATININE 1.00 12/07/2016   PROT 8.1 12/07/2016   ALBUMIN 4.8 12/07/2016   BILITOT 1.0 12/07/2016   ALKPHOS 73 12/07/2016   AST 31 12/07/2016   ALT 40 12/07/2016  .  Lab Results  Component Value Date   CHOL 178 12/08/2016   HDL 33 (L) 12/08/2016   LDLCALC 97 12/08/2016   LDLDIRECT 125.0 02/15/2016   TRIG 240 (H) 12/08/2016   CHOLHDL 5.4 12/08/2016   Lab Results  Component Value Date   HGBA1C 5.2 06/15/2015    Total Time in preparing paper work, data evaluation and todays exam - 35 minutes  Lala Lund K M.D on 12/08/2016 at 10:14 AM  [facility]   Office  [phone]

## 2016-12-08 NOTE — Progress Notes (Signed)
SLP Cancellation Note  Patient Details Name: Jerry [name] MRN: [id number] DOB: 1951-10-14   Cancelled treatment:       Reason Eval/Treat Not Completed: SLP screened, no needs identified, will sign off   Germain Osgood 12/08/2016, 12:05 PM  Germain Osgood, M.A. CCC-SLP [phone]

## 2016-12-08 NOTE — Care Management Note (Signed)
Case Management Note  Patient Details  Name: Jerry [name] MRN: [id number] Date of Birth: 10-05-1951  Subjective/Objective:  Pt in with TIA. He is from home with his spouse.                  Action/Plan: Pt discharging home with self care. No f/u per PT/OT and no DME needs. Pt has insurance and PCP. No further needs per CM.  Expected Discharge Date:  12/08/16               Expected Discharge Plan:  Home/Self Care  In-House Referral:     Discharge planning Services     Post Acute Care Choice:    Choice offered to:     DME Arranged:    DME Agency:     HH Arranged:    HH Agency:     Status of Service:  Completed, signed off  If discussed at [employer] of Stay Meetings, dates discussed:    Additional Comments:  Pollie Friar, RN 12/08/2016, 10:18 AM

## 2016-12-08 NOTE — Progress Notes (Signed)
STROKE TEAM PROGRESS NOTE   SUBJECTIVE (INTERVAL HISTORY) His "other half" Jerry [name] is at the bedside.  He reports recurrent L face/arm and hand numbness. He reports concurrent R temporal HA. He also reported dizziness and feeling unsteady on his feet.   OBJECTIVE Temp:  [97.8 F (36.6 C)-98.3 F (36.8 C)] 97.8 F (36.6 C) (02/23 0512) Pulse Rate:  [59-76] 59 (02/23 0512) Cardiac Rhythm: Sinus bradycardia;Heart block (02/23 0800) Resp:  [14-20] 20 (02/23 0512) BP: (104-183)/(65-94) 117/65 (02/23 0512) SpO2:  [98 %-100 %] 99 % (02/23 0512) Weight:  [83.9 kg (185 lb)] 83.9 kg (185 lb) (02/22 1051)  CBC:  Recent Labs Lab 12/07/16 1118 12/07/16 1129  WBC 5.2  --   NEUTROABS 3.3  --   HGB 15.9 15.6  HCT 43.6 46.0  MCV 85.5  --   PLT 268  --     Basic Metabolic Panel:  Recent Labs Lab 12/07/16 1118 12/07/16 1129  NA 137 138  K 4.6 4.5  CL 107 104  CO2 26  --   GLUCOSE 110* 105*  BUN 23* 25*  CREATININE 1.03 1.00  CALCIUM 9.6  --    HgbA1c:  Lab Results  Component Value Date   HGBA1C 5.2 06/15/2015   Urine Drug Screen: No results found for: LABOPIA, COCAINSCRNUR, LABBENZ, AMPHETMU, THCU, Jerry [name] middle aged Caucasian male not in distress. . Afebrile. Head is nontraumatic. Neck is supple without bruit.    Cardiac exam no murmur or gallop. Lungs are clear to auscultation. Distal pulses are well felt. Neurological Exam ;  Awake  Alert oriented x 3. Normal speech and language.eye movements full without nystagmus.fundi were not visualized. Vision acuity and fields appear normal. Hearing is normal. Palatal movements are normal. Face symmetric. Tongue midline. Normal strength, tone, reflexes and coordination. Normal sensation. Gait deferred.  ASSESSMENT/PLAN [name] is a 66 y.o. 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 He did not receive IV t-PA due to resolved symptoms.   TIA  CT head no acute abnormality  MRI  No acute stroke  CTA head and neck normal  2D Echo  EF 55-60%. No source of embolus   LDL 97  HgbA1c pending  Lovenox 40 mg sq daily for VTE prophylaxis  Diet Heart Room service appropriate? Yes; Fluid consistency: Thin  No antithrombotic prior to admission, now on aspirin 325 mg daily - continue either 81 or 325 at discharge  Therapy recommendations:  No therapy needs  Disposition:  Return home  Elevated BP without hx Hypertension  High on admission, improved this am  BP goal normotensive  Hyperlipidemia  Home meds:  zocor 10, resumed in hospital along with Lovaza  LDL just above goal  Agree with increased dose  Continue statin at discharge  Other Stroke Risk Factors  Hx Cigarette smoker 40 years ago  UDS not checked  Increased stress - relaxation and exercise encouraged  Other Active Problems  Low testosterone  Hospital day # 0  Jerry [name] [facility] [place] for Pager information 12/08/2016 9:34 AM  I have personally examined this patient, reviewed notes, independently viewed imaging studies, participated in medical decision making and plan of care.ROS completed by me personally and pertinent positives fully documented  I have made any additions or clarifications directly to the above note. Agree with note above. He presented  with 3 transient episodes of left face and arm paresthesias of unclear etiology possibly TIA secondary to small vessel disease. He does appear to have underlying anxiety issues as well. Recommend aspirin for stroke prevention and increase Zocor dose to 20 mg daily. Patient also counseled to but his weight and stress relaxation activities. Greater than 50% time during this 35 minute visit was spent on counseling and coordination of care about his TIA, recurrent stroke risk, evaluation plan and treatment discussion  and answering questions. Discussed with Dr. Candiss Norse.  Jerry Contras, MD Medical Director [facility] Pager: [phone] 12/08/2016 10:53 AM  To contact Stroke Continuity provider, please refer to [email or web address] After hours, contact General Neurology

## 2016-12-08 NOTE — Telephone Encounter (Signed)
Noted  

## 2016-12-08 NOTE — Discharge Instructions (Signed)
Follow with Primary MD TODD,JEFFREY ALLEN, MD in 7 days   Get CBC, CMP, 2 view Chest X ray checked  by Primary MD or SNF MD in 5-7 days ( we routinely change or add medications that can affect your baseline labs and fluid status, therefore we recommend that you get the mentioned basic workup next visit with your PCP, your PCP may decide not to get them or add new tests based on their clinical decision)  Activity: As tolerated with Full fall precautions use walker/cane & assistance as needed  Disposition Home    Diet: Heart Healthy   For Heart failure patients - Check your Weight same time everyday, if you gain over 2 pounds, or you develop in leg swelling, experience more shortness of breath or chest pain, call your Primary MD immediately. Follow Cardiac Low Salt Diet and 1.5 lit/day fluid restriction.  On your next visit with your primary care physician please Get Medicines reviewed and adjusted.  Please request your Prim.MD to go over all Hospital Tests and Procedure/Radiological results at the follow up, please get all Hospital records sent to your Prim MD by signing hospital release before you go home.  If you experience worsening of your admission symptoms, develop shortness of breath, life threatening emergency, suicidal or homicidal thoughts you must seek medical attention immediately by calling 911 or calling your MD immediately  if symptoms less severe.  You Must read complete instructions/literature along with all the possible adverse reactions/side effects for all the Medicines you take and that have been prescribed to you. Take any new Medicines after you have completely understood and accpet all the possible adverse reactions/side effects.   Do not drive, operate heavy machinery, perform activities at heights, swimming or participation in water activities or provide baby sitting services if your were admitted for syncope or siezures until you have seen by Primary MD or a Neurologist  and advised to do so again.  Do not drive when taking Pain medications.    Do not take more than prescribed Pain, Sleep and Anxiety Medications  Special Instructions: If you have smoked or chewed Tobacco  in the last 2 yrs please stop smoking, stop any regular Alcohol  and or any Recreational drug use.  Wear Seat belts while driving.   Please note  You were cared for by a hospitalist during your hospital stay. If you have any questions about your discharge medications or the care you received while you were in the hospital after you are discharged, you can call the unit and asked to speak with the hospitalist on call if the hospitalist that took care of you is not available. Once you are discharged, your primary care physician will handle any further medical issues. Please note that NO REFILLS for any discharge medications will be authorized once you are discharged, as it is imperative that you return to your primary care physician (or establish a relationship with a primary care physician if you do not have one) for your aftercare needs so that they can reassess your need for medications and monitor your lab values.

## 2016-12-08 NOTE — Progress Notes (Signed)
PT Cancellation Note  Patient Details Name: Jerry [name] MRN: [id number] DOB: 07-30-1951   Cancelled Treatment:    Reason Eval/Treat Not Completed: PT screened, no needs identified, will sign off. Pt is independent and at baseline for all functional mobility.    Lorriane Shire 12/08/2016, 8:30 AM

## 2016-12-09 LAB — HEMOGLOBIN A1C
Hgb A1c MFr Bld: 5.2 % (ref 4.8–5.6)
MEAN PLASMA GLUCOSE: 103 mg/dL

## 2016-12-11 ENCOUNTER — Telehealth: Payer: Self-pay

## 2016-12-11 NOTE — Telephone Encounter (Signed)
D/C 12/08/16 To: home  Spoke with pt and he states that he is doing well. He denies any repeat episodes.   Transition Care Management Follow-up Telephone Call  How have you been since you were released from the hospital? Very well   Do you understand why you were in the hospital? yes   Do you understand the discharge instrcutions? yes  Items Reviewed:  Medications reviewed: yes  Allergies reviewed: yes  Dietary changes reviewed: yes  Referrals reviewed: yes   Functional Questionnaire:   Activities of Daily Living (ADLs):   He states they are independent in the following: ambulation, bathing and hygiene, feeding, continence, grooming, toileting and dressing States they require assistance with the following: none   Any transportation issues/concerns?: no   Any patient concerns? no   Confirmed importance and date/time of follow-up visits scheduled: yes   Confirmed with patient if condition begins to worsen call PCP or go to the ER.  Patient was given the Call-a-Nurse line [phone]: yes

## 2016-12-12 NOTE — Telephone Encounter (Signed)
**  Coming March 12th**  [facility]!!!  Same Day Appointments for Acute Care: Sprains, Injuries, cuts, abrasions Colds, fevers, flu, sore throats, upset stomachs Fever, ear pain Sinus and eye infections Animal/insect bites  3 Easy Ways to Schedule: Walk-In Scheduling Call in scheduling Mychart Sign-up: [email or web address]

## 2016-12-19 ENCOUNTER — Ambulatory Visit (INDEPENDENT_AMBULATORY_CARE_PROVIDER_SITE_OTHER): Payer: BLUE CROSS/BLUE SHIELD | Admitting: Family Medicine

## 2016-12-19 ENCOUNTER — Encounter: Payer: Self-pay | Admitting: Family Medicine

## 2016-12-19 VITALS — BP 134/90 | HR 62 | Temp 98.3°F | Wt 186.0 lb

## 2016-12-19 DIAGNOSIS — G458 Other transient cerebral ischemic attacks and related syndromes: Secondary | ICD-10-CM

## 2016-12-19 DIAGNOSIS — I10 Essential (primary) hypertension: Secondary | ICD-10-CM | POA: Diagnosis not present

## 2016-12-19 DIAGNOSIS — E781 Pure hyperglyceridemia: Secondary | ICD-10-CM

## 2016-12-19 MED ORDER — LOSARTAN POTASSIUM 25 MG PO TABS: 25.0000 mg | ORAL_TABLET | Freq: Every day | ORAL | 4 refills | Status: DC

## 2016-12-19 MED ORDER — SIMVASTATIN 20 MG PO TABS: 20.0000 mg | ORAL_TABLET | Freq: Every day | ORAL | 4 refills | Status: DC

## 2016-12-19 NOTE — Patient Instructions (Signed)
Cozaar 25 mg......... one tablet daily in the morning to lower your blood pressure......... BP goal 130/80 or less  Zocor 20 mg.........Marland Kitchen 1 tablet at bedtime to lower your cholesterol........Marland Kitchen LDL goal below 70  Continue the daily aspirin  Follow-up labs in 6 weeks............ follow-up office visit 1 week after labs

## 2016-12-19 NOTE — Progress Notes (Signed)
Pre visit review using our clinic review tool, if applicable. No additional management support is needed unless otherwise documented below in the visit note. 

## 2016-12-19 NOTE — Progress Notes (Signed)
Jerry [name] is a 66 year old single male nonsmoker......... did smoke for 3 years 40 years ago.......... who comes in today for follow-up having been hospitalized with a TIA  He states he went to the beach nothing unusual happened no history of any excessive alcohol or drug use and when he got home he had some numbness in the left side of his face and his left arm and leg. Alesse 45 minutes and went away. Over a five-day period time he had 3 episodes all the same. None of which left him with a neurologic deficit. He went the hospital on February 22 was admitted and discharged on the 23rd. He had numerous diagnostic studies all of which were normal. The recommend he increase his Zocor to 20 mg a day which she's not done yet.  He's never had a total was blood pressure however BP today 134/90. Because of the TI had recommend we get his pressure below 130/80.  He's also taken an aspirin tablet daily now again all diagnostic studies CT carotid [place] were all normal.  Family history noncontributory  BP 134/90 (BP Location: Left Arm, Patient Position: Sitting, Cuff Size: Large)   Pulse 62   Temp 98.3 F (36.8 C) (Oral)   Wt 186 lb (84.4 kg)   SpO2 98%   BMI 25.94 kg/m  In general he is well-developed well-nourished male no acute distress vital signs stable except for BP 134/90. Neurologic exam normal  #1 TIA,,,,,,,,,,,,,, continue diet exercise maintaining good weight will lower blood pressure and cholesterol and continue aspirin,

## 2017-01-23 ENCOUNTER — Encounter: Payer: Self-pay | Admitting: Diagnostic Neuroimaging

## 2017-01-23 ENCOUNTER — Ambulatory Visit (INDEPENDENT_AMBULATORY_CARE_PROVIDER_SITE_OTHER): Payer: BLUE CROSS/BLUE SHIELD | Admitting: Diagnostic Neuroimaging

## 2017-01-23 ENCOUNTER — Encounter (INDEPENDENT_AMBULATORY_CARE_PROVIDER_SITE_OTHER): Payer: Self-pay

## 2017-01-23 VITALS — BP 110/76 | HR 66 | Ht 71.0 in | Wt 183.4 lb

## 2017-01-23 DIAGNOSIS — R0683 Snoring: Secondary | ICD-10-CM | POA: Diagnosis not present

## 2017-01-23 DIAGNOSIS — G458 Other transient cerebral ischemic attacks and related syndromes: Secondary | ICD-10-CM

## 2017-01-23 DIAGNOSIS — R202 Paresthesia of skin: Secondary | ICD-10-CM

## 2017-01-23 DIAGNOSIS — R200 Anesthesia of skin: Secondary | ICD-10-CM

## 2017-01-23 NOTE — Progress Notes (Signed)
[facility]  PATIENT: Jerry [name] DOB: 10-Jan-1951  REFERRING CLINICIAN: Candiss Norse, P HISTORY FROM: patient  REASON FOR VISIT: new consult    HISTORICAL  CHIEF COMPLAINT:  Chief Complaint  Patient presents with  . Hospitalization Follow-up  . Transient Ischemic Attack    doing ok, still with some transient numbness L face and arm, as well as dizziness    HISTORY OF PRESENT ILLNESS:   66 year old male with hypercholesterolemia, here for evaluation of TIA.  Patient had onset of left face and left arm tingling on 12/04/16 lasting less than one hour with full resolution. Patient had another episode on 12/06/16. On 12/07/16 patient had a third event, called his PCP for advice, and was referred to go to the hospital for evaluation. Code stroke was activated, but IV TPA was not given due to minimal neurologic deficits. CT of the head and neck were obtained which showed no significant stenosis. Stroke workup was completed. Patient was diagnosed with TIA and discharged home on aspirin, statin, blood pressure medicine.  Since that time patient is doing well. He has had a few more episodes of minor left facial numbness, right facial numbness, left arm numbness lasting 1-2 minutes at a time. Patient wonders if stress may be a factor. Of note patient did have mild occipital headache following his left face, left arm numbness event that led him to go to the emergency room. No nausea or vomiting. No sensitivity to light or sound. No prior history of migraine.  Patient reports history of snoring, but no witnessed apneas. He tends to wake up multiple times at night. He's never had a sleep study.  Patient is trying to improve his nutrition and physical activity since discharge.  Patient has been under more stress in the last few months.   REVIEW OF SYSTEMS: Full 14 system review of systems performed and negative with exception of: Blurred vision numbness  snoring.   ALLERGIES: No Known Allergies  HOME MEDICATIONS: Outpatient Medications Prior to Visit  Medication Sig Dispense Refill  . aspirin 325 MG tablet Take 1 tablet (325 mg total) by mouth daily. 30 tablet 0  . AXIRON 30 MG/ACT SOLN Place 1 application onto the skin daily.     . fluticasone (FLONASE) 50 MCG/ACT nasal spray Place 2 sprays into both nostrils daily as needed for rhinitis.    Marland Kitchen loratadine (CLARITIN) 10 MG tablet Take 10 mg by mouth daily as needed for allergies.     Marland Kitchen losartan (COZAAR) 25 MG tablet Take 1 tablet (25 mg total) by mouth daily. 100 tablet 4  . Multiple Vitamin (MULTIVITAMIN WITH MINERALS) TABS tablet Take 1 tablet by mouth daily.    . simvastatin (ZOCOR) 20 MG tablet Take 1 tablet (20 mg total) by mouth daily at 6 PM. 100 tablet 4  . tadalafil (CIALIS) 20 MG tablet Take 10-20 mg by mouth daily as needed for erectile dysfunction.    Marland Kitchen doxycycline (VIBRAMYCIN) 100 MG capsule Take 100 mg by mouth daily.    Marland Kitchen omega-3 acid ethyl esters (LOVAZA) 1 g capsule Take 1 g by mouth daily.     No facility-administered medications prior to visit.     PAST MEDICAL HISTORY: Past Medical History:  Diagnosis Date  . Allergy   . Hyperlipidemia   . Hypogonadism male   . TIA (transient ischemic attack) 11/2016    PAST SURGICAL HISTORY: Past Surgical History:  Procedure Laterality Date  . CHOLECYSTECTOMY  2007    FAMILY HISTORY:  Family History  Problem Relation Age of Onset  . Colon cancer Neg Hx     SOCIAL HISTORY:  Social History   Social History  . Marital status: Married    Spouse name: N/A  . Number of children: N/A  . Years of education: N/A   Occupational History  . Not on file.   Social History Main Topics  . Smoking status: Never Smoker  . Smokeless tobacco: Never Used  . Alcohol use Yes     Comment: occ, beer and wine  . Drug use: No  . Sexual activity: Not on file   Other Topics Concern  . Not on file   Social History Narrative    Lives at home alone.  Works for [employer].  12 th grade education.  Drinks caffeine (coffee) daily.      PHYSICAL EXAM  GENERAL EXAM/CONSTITUTIONAL: Vitals:  Vitals:   01/23/17 0831  BP: 110/76  Pulse: 66  Weight: 183 lb 6.4 oz (83.2 kg)  Height: 5\' 11"  (1.803 m)     Body mass index is 25.58 kg/m.  Visual Acuity Screening   Right eye Left eye Both eyes  Without correction: 20/40 20/30   With correction:        Patient is in no distress; well developed, nourished and groomed; neck is supple  CARDIOVASCULAR:  Examination of carotid arteries is normal; no carotid bruits  Regular rate and rhythm, no murmurs  Examination of peripheral vascular system by observation and palpation is normal  EYES:  Ophthalmoscopic exam of optic discs and posterior segments is normal; no papilledema or hemorrhages  MUSCULOSKELETAL:  Gait, strength, tone, movements noted in Neurologic exam below  NEUROLOGIC: MENTAL STATUS:  No flowsheet data found.  awake, alert, oriented to person, place and time  recent and remote memory intact  normal attention and concentration  language fluent, comprehension intact, naming intact,   fund of knowledge appropriate  CRANIAL NERVE:   2nd - no papilledema on fundoscopic exam  2nd, 3rd, 4th, 6th - pupils equal and reactive to light, visual fields full to confrontation, extraocular muscles intact, no nystagmus  5th - facial sensation symmetric  7th - facial strength symmetric  8th - hearing intact  9th - palate elevates symmetrically, uvula midline  11th - shoulder shrug symmetric  12th - tongue protrusion midline  MOTOR:   normal bulk and tone, full strength in the BUE, BLE  SENSORY:   normal and symmetric to light touch, temperature, vibration  COORDINATION:   finger-nose-finger, fine finger movements normal  REFLEXES:   deep tendon reflexes present and symmetric  GAIT/STATION:   narrow based gait; able  to walk tandem; romberg is negative    DIAGNOSTIC DATA (LABS, IMAGING, TESTING) - I reviewed patient records, labs, notes, testing and imaging myself where available.  Lab Results  Component Value Date   WBC 5.2 12/07/2016   HGB 15.6 12/07/2016   HCT 46.0 12/07/2016   MCV 85.5 12/07/2016   PLT 268 12/07/2016      Component Value Date/Time   NA 138 12/07/2016 1129   K 4.5 12/07/2016 1129   CL 104 12/07/2016 1129   CO2 26 12/07/2016 1118   GLUCOSE 105 (H) 12/07/2016 1129   BUN 25 (H) 12/07/2016 1129   CREATININE 1.00 12/07/2016 1129   CALCIUM 9.6 12/07/2016 1118   PROT 8.1 12/07/2016 1118   ALBUMIN 4.8 12/07/2016 1118   AST 31 12/07/2016 1118   ALT 40 12/07/2016 1118   ALKPHOS  73 12/07/2016 1118   BILITOT 1.0 12/07/2016 1118   GFRNONAA >60 12/07/2016 1118   GFRAA >60 12/07/2016 1118   Lab Results  Component Value Date   CHOL 178 12/08/2016   HDL 33 (L) 12/08/2016   LDLCALC 97 12/08/2016   LDLDIRECT 125.0 02/15/2016   TRIG 240 (H) 12/08/2016   CHOLHDL 5.4 12/08/2016   Lab Results  Component Value Date   HGBA1C 5.2 12/08/2016   No results found for: VITAMINB12 Lab Results  Component Value Date   TSH 4.89 (H) 02/15/2016    12/07/16 CTA head / neck [I reviewed images myself and agree with interpretation. -VRP]  1. Normal for age CTA Head and Neck. No significant atherosclerosis or stenosis. 2. CT appearance of the brain remains normal.  12/07/16 MRI brain [I reviewed images myself and agree with interpretation. -VRP]  1. No acute intracranial abnormality. 2. Minimal chronic microvascular ischemic changes of white matter. 3. Mild right anterior ethmoid sinus mucosal thickening.  12/07/16 TTE  - No cardiac source of emboli was indentified.    ASSESSMENT AND PLAN  66 y.o. year old male here with several transient left face and arm numbness attacks (Feb 2018), followed by headache, with completed TIA/stroke workup.    Ddx: TIA vs complicated migraine vs  stress reaction  1. Numbness and tingling of left side of face   2. Left arm numbness   3. Other specified transient cerebral ischemias      PLAN: - continue aspirin 325mg  daily - continue simvastatin and losartan - reviewed nutrition and fitness strategies - check sleep study (HTN, snoring, TIA; rule out OSA)  Orders Placed This Encounter  Procedures  . Ambulatory referral to Sleep Studies   Return if symptoms worsen or fail to improve, for return to PCP.    Penni Bombard, MD 2/91/9166, 0:60 AM Certified in Neurology, Neurophysiology and Neuroimaging  [facility] [address], [place], [place] [address] [phone]

## 2017-01-23 NOTE — Patient Instructions (Signed)
Thank you for coming to see [place] at [facility]. I hope we have been able to provide you high quality care today.  You may receive a patient satisfaction survey over the next few weeks. We would appreciate your feedback and comments so that we may continue to improve ourselves and the health of our patients.  - continue current medications  - I will setup sleep study   ~~~~~~~~~~~~~~~~~~~~~~~~~~~~~~~~~~~~~~~~~~~~~~~~~~~~~~~~~~~~~~~~~  DR. Joevanni Roddey'S GUIDE TO HAPPY AND HEALTHY LIVING These are some of my general health and wellness recommendations. Some of them may apply to you better than others. Please use common sense as you try these suggestions and feel free to ask me any questions.   ACTIVITY/FITNESS Mental, social, emotional and physical stimulation are very important for brain and body health. Try learning a new activity (arts, music, language, sports, games).  Keep moving your body to the best of your abilities. You can do this at home, inside or outside, the park, community center, gym or anywhere you like. Consider a physical therapist or personal trainer to get started. Consider the app Sworkit. Fitness trackers such as smart-watches, smart-phones or Fitbits can help as well.   NUTRITION Eat more plants: colorful vegetables, nuts, seeds and berries.  Eat less sugar, salt, preservatives and processed foods.  Avoid toxins such as cigarettes and alcohol.  Drink water when you are thirsty. Warm water with a slice of lemon is an excellent morning drink to start the day.  Consider these websites for more information The Nutrition Source (https://www.henry-hernandez.biz/) Precision Nutrition (WindowBlog.ch)   RELAXATION Consider practicing mindfulness meditation or other relaxation techniques such as deep breathing, prayer, yoga, tai chi, massage. See website mindful.org or the apps Headspace or Calm to help get  started.   SLEEP Try to get at least 7-8+ hours sleep per day. Regular exercise and reduced caffeine will help you sleep better. Practice good sleep hygeine techniques. See website sleep.org for more information.   PLANNING Prepare estate planning, living will, healthcare POA documents. Sometimes this is best planned with the help of an attorney. Theconversationproject.org and agingwithdignity.org are excellent resources.

## 2017-02-13 ENCOUNTER — Other Ambulatory Visit (INDEPENDENT_AMBULATORY_CARE_PROVIDER_SITE_OTHER): Payer: BLUE CROSS/BLUE SHIELD

## 2017-02-13 DIAGNOSIS — E781 Pure hyperglyceridemia: Secondary | ICD-10-CM | POA: Diagnosis not present

## 2017-02-13 DIAGNOSIS — I10 Essential (primary) hypertension: Secondary | ICD-10-CM

## 2017-02-13 LAB — BASIC METABOLIC PANEL
BUN: 18 mg/dL (ref 6–23)
CHLORIDE: 104 meq/L (ref 96–112)
CO2: 29 mEq/L (ref 19–32)
Calcium: 9.4 mg/dL (ref 8.4–10.5)
Creatinine, Ser: 1.04 mg/dL (ref 0.40–1.50)
GFR: 76.31 mL/min (ref >60.00)
GLUCOSE: 101 mg/dL — AB (ref 70–99)
POTASSIUM: 4.7 meq/L (ref 3.5–5.1)
SODIUM: 139 meq/L (ref 135–145)

## 2017-02-13 LAB — HEPATIC FUNCTION PANEL
ALBUMIN: 4.1 g/dL (ref 3.5–5.2)
ALK PHOS: 65 U/L (ref 39–117)
ALT: 26 U/L (ref 0–53)
AST: 26 U/L (ref 0–37)
Bilirubin, Direct: 0.1 mg/dL (ref 0.0–0.3)
TOTAL PROTEIN: 6.7 g/dL (ref 6.0–8.3)
Total Bilirubin: 0.7 mg/dL (ref 0.2–1.2)

## 2017-02-13 LAB — LIPID PANEL
CHOLESTEROL: 163 mg/dL (ref 0–200)
HDL: 34 mg/dL — ABNORMAL LOW (ref >39.00)
NonHDL: 129.47
Total CHOL/HDL Ratio: 5
Triglycerides: 226 mg/dL — ABNORMAL HIGH (ref 0.0–149.0)
VLDL: 45.2 mg/dL — ABNORMAL HIGH (ref 0.0–40.0)

## 2017-02-13 LAB — LDL CHOLESTEROL, DIRECT: Direct LDL: 81 mg/dL

## 2017-02-19 ENCOUNTER — Encounter: Payer: Self-pay | Admitting: Neurology

## 2017-02-19 ENCOUNTER — Ambulatory Visit (INDEPENDENT_AMBULATORY_CARE_PROVIDER_SITE_OTHER): Payer: BLUE CROSS/BLUE SHIELD | Admitting: Neurology

## 2017-02-19 VITALS — BP 140/78 | HR 76 | Resp 16 | Ht 71.0 in | Wt 185.0 lb

## 2017-02-19 DIAGNOSIS — R0683 Snoring: Secondary | ICD-10-CM

## 2017-02-19 DIAGNOSIS — G459 Transient cerebral ischemic attack, unspecified: Secondary | ICD-10-CM | POA: Diagnosis not present

## 2017-02-19 DIAGNOSIS — G478 Other sleep disorders: Secondary | ICD-10-CM | POA: Diagnosis not present

## 2017-02-19 DIAGNOSIS — R351 Nocturia: Secondary | ICD-10-CM

## 2017-02-19 NOTE — Progress Notes (Signed)
Subjective:    Patient ID: Jerry [name] is a 66 y.o. male.  HPI     Star Age, MD, PhD [facility] [address]O. [place], [place] [address]  Dear Bonnita Levan,   I saw your patient, Jerry [name], upon your kind request in my clinic today for initial consultation of his sleep disorder, in particular, concern for underlying obstructive sleep apnea. The patient is unaccompanied today. As you know, Jerry [name] is a 66 year old right-handed gentleman with an underlying medical history of allergies, hyperlipidemia, hypogonadism, history of TIA, history of paresthesias, and borderline overweight state, who reports snoring and excessive daytime somnolence. I reviewed your office note from 01/23/2017. His Epworth sleepiness score is 4 out of 24, fatigue score is 17 out of 63. He is single and lives alone. He has 1 child. He works for a [employer]. He quit smoking in 1972, drinks alcohol in the form of wine or beer on weekends typically, caffeine in the form of coffee, one cup per day on average.  He had transient L sided numbness on 12/07/16. He has TIA w/u in the hospital.  His GF complains about his snoring. He denies morning HAs, but has nocturia once per night. He has sleep disruption. He does not wake up rested. Rare naps on WEs reported. Bedtime is around 10:30, may take 15-20 min to fall asleep, may wake up 2-4 times per night. WT is around 6 AM. His main complaint is daytime tiredness and sleep disruption, loud snoring which disturbs his girlfriend. He reports no family history of OSA and denies frank RLS symptoms. He has had some mild intermittent left-sided numbness and tingling. Sometimes he has right-sided tingling.  His Past Medical History Is Significant For: Past Medical History:  Diagnosis Date  . Allergy   . Hyperlipidemia   . Hypogonadism male   . TIA (transient ischemic attack) 11/2016    His Past Surgical History Is Significant  For: Past Surgical History:  Procedure Laterality Date  . CHOLECYSTECTOMY  2007    His Family History Is Significant For: Family History  Problem Relation Age of Onset  . Colon cancer Neg Hx     His Social History Is Significant For: Social History   Social History  . Marital status: Married    Spouse name: N/A  . Number of children: 1  . Years of education: 12   Social History Main Topics  . Smoking status: Former Research scientist (life sciences)  . Smokeless tobacco: Never Used     Comment: Quit in 1972  . Alcohol use Yes     Comment: occ, beer and wine  . Drug use: No  . Sexual activity: Not Asked   Other Topics Concern  . None   Social History Narrative   Lives at home alone.  Works for [employer].  12 th grade education.  Drinks caffeine (coffee) daily.     His Allergies Are:  No Known Allergies:   His Current Medications Are:  Outpatient Encounter Prescriptions as of 02/19/2017  Medication Sig  . aspirin 325 MG tablet Take 1 tablet (325 mg total) by mouth daily.  Hinda Kehr 30 MG/ACT SOLN Place 1 application onto the skin daily.   . fluticasone (FLONASE) 50 MCG/ACT nasal spray Place 2 sprays into both nostrils daily as needed for rhinitis.  Marland Kitchen loratadine (CLARITIN) 10 MG tablet Take 10 mg by mouth daily as needed for allergies.   Marland Kitchen losartan (COZAAR) 25 MG tablet Take  1 tablet (25 mg total) by mouth daily.  . Multiple Vitamin (MULTIVITAMIN WITH MINERALS) TABS tablet Take 1 tablet by mouth daily.  . simvastatin (ZOCOR) 20 MG tablet Take 1 tablet (20 mg total) by mouth daily at 6 PM.  . tadalafil (CIALIS) 20 MG tablet Take 10-20 mg by mouth daily as needed for erectile dysfunction.   No facility-administered encounter medications on file as of 02/19/2017.   :  Review of Systems:  Out of a complete 14 point review of systems, all are reviewed and negative with the exception of these symptoms as listed below: Review of Systems  Neurological:       Patient states that he has  trouble falling asleep, snores, wakes up feeling tired, denies taking naps.    Epworth Sleepiness Scale 0= would never doze 1= slight chance of dozing 2= moderate chance of dozing 3= high chance of dozing  Sitting and reading:0 Watching TV:1 Sitting inactive in a public place (ex. Theater or meeting):0 As a passenger in a car for an hour without a break:1 Lying down to rest in the afternoon:2 Sitting and talking to someone:0 Sitting quietly after lunch (no alcohol):0 In a car, while stopped in traffic:0 Total:4  Objective:  Neurologic Exam  Physical Exam Physical Examination:   Vitals:   02/19/17 1114  BP: 140/78  Pulse: 76  Resp: 16    General Examination: The patient is a very pleasant 66 y.o. male in no acute distress. He appears well-developed and well-nourished and well groomed.   HEENT: Normocephalic, atraumatic, pupils are equal, round and reactive to light and accommodation. Funduscopic exam is normal with sharp disc margins noted. Extraocular tracking is good without limitation to gaze excursion or nystagmus noted. Normal smooth pursuit is noted. Hearing is grossly intact. Tympanic membranes are clear bilaterally. Face is symmetric with normal facial animation and normal facial sensation. Speech is clear with no dysarthria noted. There is no hypophonia. There is no lip, neck/head, jaw or voice tremor. Neck is supple with full range of passive and active motion. There are no carotid bruits on auscultation. Oropharynx exam reveals: mild mouth dryness, adequate dental hygiene and moderate airway crowding, due to Redundant soft palate, tonsils in place which are bilaterally quite small in appearance, Mallampati class III. Neck circumference is 17-1/8 inches. Tongue protrudes centrally and palate elevates symmetrically.   Chest: Clear to auscultation without wheezing, rhonchi or crackles noted.  Heart: S1+S2+0, regular and normal without murmurs, rubs or gallops noted.    Abdomen: Soft, non-tender and non-distended with normal bowel sounds appreciated on auscultation.  Extremities: There is no pitting edema in the distal lower extremities bilaterally. Pedal pulses are intact.  Skin: Warm and dry without trophic changes noted. There are no varicose veins.  Musculoskeletal: exam reveals no obvious joint deformities, tenderness or joint swelling or erythema.   Neurologically:  Mental status: The patient is awake, alert and oriented in all 4 spheres. His immediate and remote memory, attention, language skills and fund of knowledge are appropriate. There is no evidence of aphasia, agnosia, apraxia or anomia. Speech is clear with normal prosody and enunciation. Thought process is linear. Mood is normal and affect is normal.  Cranial nerves II - XII are as described above under HEENT exam. In addition: shoulder shrug is normal with equal shoulder height noted. Motor exam: Normal bulk, strength and tone is noted. There is no drift, tremor or rebound. Romberg is negative. Reflexes are 2+ throughout. Babinski: Toes are flexor bilaterally. Fine  motor skills and coordination: intact with normal finger taps, normal hand movements, normal rapid alternating patting, normal foot taps and normal foot agility.  Cerebellar testing: No dysmetria or intention tremor on finger to nose testing. Heel to shin is unremarkable bilaterally. There is no truncal or gait ataxia.  Sensory exam: intact to light touch, vibration, temperature sense in the upper and lower extremities.  Gait, station and balance: He stands easily. No veering to one side is noted. No leaning to one side is noted. Posture is age-appropriate and stance is narrow based. Gait shows normal stride length and normal pace. No problems turning are noted. Tandem walk is unremarkable.    Assessment and Plan:  In summary, Jerry [name] is a very pleasant 66 y.o.-year old male with an underlying medical history of allergies,  hyperlipidemia, hypogonadism, history of TIA, history of paresthesias, and borderline overweight state, whose history and physical exam are concerning for obstructive sleep apnea (OSA). I had a long chat with the patient about my findings and the diagnosis of OSA, its prognosis and treatment options. We talked about medical treatments, surgical interventions and non-pharmacological approaches. I explained in particular the risks and ramifications of untreated moderate to severe OSA, especially with respect to developing cardiovascular disease down the Road, including congestive heart failure, difficult to treat hypertension, cardiac arrhythmias, or stroke. Even type 2 diabetes has, in part, been linked to untreated OSA. Symptoms of untreated OSA include daytime sleepiness, memory problems, mood irritability and mood disorder such as depression and anxiety, lack of energy, as well as recurrent headaches, especially morning headaches. We talked about trying to maintain a  healthy lifestyle in general, as well as the importance of weight control. I encouraged the patient to eat healthy, exercise daily and keep well hydrated, to keep a scheduled bedtime and wake time routine, to not skip any meals and eat healthy snacks in between meals. I advised the patient not to drive when feeling sleepy. I recommended the following at this time: sleep study with potential positive airway pressure titration. (We will score hypopneas at 3%).   I explained the sleep test procedure to the patient. I would like to see him back after the sleep study is completed and encouraged him to call with any interim questions, concerns, problems or updates.   Thank you very much for allowing me to participate in the care of this nice patient. If I can be of any further assistance to you please do not hesitate to talk to me .  Sincerely,   Star Age, MD, PhD

## 2017-02-19 NOTE — Patient Instructions (Signed)

## 2017-02-20 ENCOUNTER — Encounter: Payer: Self-pay | Admitting: Family Medicine

## 2017-02-20 ENCOUNTER — Ambulatory Visit (INDEPENDENT_AMBULATORY_CARE_PROVIDER_SITE_OTHER): Payer: BLUE CROSS/BLUE SHIELD | Admitting: Family Medicine

## 2017-02-20 VITALS — BP 116/76 | Temp 98.4°F | Wt 186.0 lb

## 2017-02-20 DIAGNOSIS — Z23 Encounter for immunization: Secondary | ICD-10-CM | POA: Diagnosis not present

## 2017-02-20 DIAGNOSIS — Z0000 Encounter for general adult medical examination without abnormal findings: Secondary | ICD-10-CM | POA: Diagnosis not present

## 2017-02-20 DIAGNOSIS — G458 Other transient cerebral ischemic attacks and related syndromes: Secondary | ICD-10-CM

## 2017-02-20 DIAGNOSIS — I10 Essential (primary) hypertension: Secondary | ICD-10-CM | POA: Diagnosis not present

## 2017-02-20 DIAGNOSIS — E781 Pure hyperglyceridemia: Secondary | ICD-10-CM

## 2017-02-20 MED ORDER — LOSARTAN POTASSIUM 50 MG PO TABS: 50.0000 mg | ORAL_TABLET | Freq: Every day | ORAL | 4 refills | Status: DC

## 2017-02-20 NOTE — Patient Instructions (Addendum)
Continue current medications, except increase the Cozaar to 50 mg daily. Check your blood pressure daily in the morning over the next 4-6 weeks. Blood pressure goal 135/85 or less. If after 6 weeks she see your blood pressure is not at goal then call we'll increase her dose to 75 mg daily.  Follow-up in one year sooner if any problems

## 2017-02-20 NOTE — Progress Notes (Signed)
Pre visit review using our clinic review tool, if applicable. No additional management support is needed unless otherwise documented below in the visit note. 

## 2017-02-20 NOTE — Progress Notes (Signed)
Jerry [name] is a 66 year old male nonsmoker who comes in today for evaluation of allergic rhinitis, hypertension, hyperlipidemia and possibly obstructive sleep apnea  He takes Claritin and Flonase for allergic rhinitis  He takes Zocor 20 mg along with an 81 mg baby aspirin for hyperlipidemia  He sees urologist on a yearly basis because of testosterone deficiency. He's on a supplement plus Cialis when necessary  He he takes Cozaar 25 mg daily. BP at home is running 140/90.  He gets routine eye care, dental care, colonoscopy he's had 3 in the past. Because he's had some polyps.  Tetanus booster up-to-date. He was given-year-old shingles vaccine year ago will start the new shingles vaccines now.  He saw his neurologist recently. They think his symptoms may be related to sleep apnea. He's scheduled for further diagnostic studies.  14 point review of systems reviewed and otherwise negative  BP 116/76 (BP Location: Left Arm)   Temp 98.4 F (36.9 C) (Oral)   Wt 186 lb (84.4 kg)   BMI 25.94 kg/m  Examination HEENT were negative neck was supple thyroid is not enlarged no carotid bruits cardiopulmonary exam normal abdominal exam normal genitorectal deferred because his done by his urologist. Extremities normal skin normal peripheral pulses normal  #1 allergic rhinitis continue Flonase and Claritin  #2 hypertension not at goal increase Cozaar to 50 mg daily BP check daily follow-up in 4-6 weeks of blood pressure not normal, 135/85 or less  Hyperlipidemia continue Zocor and aspirin  Low testosterone continue testosterone supplement and follow-up by urologist  #5 question sleep apnea follow-up by neurologist

## 2017-03-14 ENCOUNTER — Ambulatory Visit (INDEPENDENT_AMBULATORY_CARE_PROVIDER_SITE_OTHER): Payer: BLUE CROSS/BLUE SHIELD | Admitting: Neurology

## 2017-03-14 DIAGNOSIS — G4761 Periodic limb movement disorder: Secondary | ICD-10-CM | POA: Diagnosis not present

## 2017-03-14 DIAGNOSIS — G4720 Circadian rhythm sleep disorder, unspecified type: Secondary | ICD-10-CM

## 2017-03-15 NOTE — Progress Notes (Signed)
Patient referred by Dr. Leta Baptist, seen by me on 02/19/17, diagnostic PSG on 03/14/17.   Please call and notify the patient that the recent sleep study did not show any significant obstructive sleep apnea, with the exception of mild intermittent snoring and borderline OSA when sleeping supine; for this avoidance of the supine sleep position is recommended. . Mild or borderline leg movements in sleep were noted, but not disruptive to sleep.  Please remind patient to try to maintain good sleep hygiene, which means: Keep a regular sleep and wake schedule, try not to exercise or have a meal within 2 hours of your bedtime, try to keep your bedroom conducive for sleep, that is, cool and dark, without light distractors such as an illuminated alarm clock, and refrain from watching TV right before sleep or in the middle of the night and do not keep the TV or radio on during the night. Also, try not to use or play on electronic devices at bedtime, such as your cell phone, tablet PC or laptop. If you like to read at bedtime on an electronic device, try to dim the background light as much as possible. Do not eat in the middle of the night.   Please inform patient that he can FU with Dr. Leta Baptist as scheduled.  Once you have spoken to patient, you can close this encounter.   Thanks,  Star Age, MD, PhD [facility])

## 2017-03-15 NOTE — Procedures (Signed)
PATIENT'S NAME:  Jerry, [name] DOB:      [birth year]      MR#:    [id number]     DATE OF RECORDING: 03/14/2017 REFERRING M.D.: Dr. Andrey [name], PCP: Jerry Kern, MD Study Performed:   Baseline Polysomnogram HISTORY:  66 year old right-handed gentleman with an underlying medical history of allergies, hyperlipidemia, hypogonadism, history of TIA, history of paresthesias, and borderline overweight state, who reports snoring and excessive daytime somnolence. The patient endorsed the Epworth Sleepiness Scale at 4 points. The patient's weight 185 pounds with a height of 71 (inches), resulting in a BMI of 25.9 kg/m2. The patient's neck circumference measured 17 inches.  CURRENT MEDICATIONS: Aspirin, Axiron, Flonase, Claritin, Cozaar, Zocor, Cialis   PROCEDURE:  This is a multichannel digital polysomnogram utilizing the Somnostar 11.2 system.  Electrodes and sensors were applied and monitored per AASM Specifications.   EEG, EOG, Chin and Limb EMG, were sampled at 200 Hz.  ECG, Snore and Nasal Pressure, Thermal Airflow, Respiratory Effort, CPAP Flow and Pressure, Oximetry was sampled at 50 Hz. Digital video and audio were recorded.      BASELINE STUDY  Lights Out was at 23:00 and Lights On at 05:11.  Total recording time (TRT) was 371.5 minutes, with a total sleep time (TST) of  309.5 minutes.   The patient's sleep latency was 29.5 minutes, which is delayed. REM latency was 82.5 minutes, which is normal.  The sleep efficiency was 83.3 %.     SLEEP ARCHITECTURE: WASO (Wake after sleep onset) was 38.5 minutes with mild sleep fragmentation noted.  There were 18.5 minutes in Stage N1, 102 minutes Stage N2, 102 minutes Stage N3 and 87 minutes in Stage REM.  The percentage of Stage N1 was 6.%, Stage N2 was 33.%, which is reduced, Stage N3 was 33.%, which increased, and Stage R (REM sleep) was 28.1%, which is mildly increased.  The arousals were noted as: 37 were spontaneous, 3 were associated with PLMs, 8  were associated with respiratory events.    Audio and video analysis did not show any abnormal or unusual movements, behaviors, phonations or vocalizations.  The patient took 1 bathroom break. Mild intermittent snoring was noted. The EKG was in keeping with normal sinus rhythm (NSR).  RESPIRATORY ANALYSIS:  There were a total of 9 respiratory events:  1 obstructive apneas, 0 central apneas and 0 mixed apneas with a total of 1 apneas and an apnea index (AI) of .2 /hour. There were 8 hypopneas with a hypopnea index of 1.6 /hour. The patient also had 3 respiratory event related arousals (RERAs).      The total APNEA/HYPOPNEA INDEX (AHI) was 1.7/hour and the total RESPIRATORY DISTURBANCE INDEX was 2.3 /hour.  7 events occurred in REM sleep and 4 events in NREM. The REM AHI was 4.8 /hour, versus a non-REM AHI of .5. The patient spent 69.5 minutes of total sleep time in the supine position and 240 minutes in non-supine.. The supine AHI was 5.2 versus a non-supine AHI of 0.8.  OXYGEN SATURATION & C02:  The Wake baseline 02 saturation was 97%, with the lowest being 87% (likely error due to signal loss, true nadir appeared to be 90% during supine REM sleep). Time spent below 89% saturation equaled 3 minutes (likely overestimated).  PERIODIC LIMB MOVEMENTS:  The patient had a total of 82 Periodic Limb Movements.  The Periodic Limb Movement (PLM) index was 15.9 and the PLM Arousal index was .6/hour.    Post-study, the patient indicated that sleep  was the same as usual.   IMPRESSION:  1. Periodic Limb Movement Disorder (PLMD) 2. Dysfunctions associated with sleep stages or arousal from sleep  RECOMMENDATIONS:  1. This study does not demonstrate any significant obstructive or central sleep disordered breathing with the exception of mild intermittent snoring and borderline OSA when sleeping supine; for this avoidance of the supine sleep position is recommended.  2. Mild or borderline PLMs (periodic limb  movements of sleep) were noted during this study with no significant arousals; clinical correlation is recommended.  3. This study shows sleep fragmentation and abnormal sleep stage percentages; these are nonspecific findings and per se do not signify an intrinsic sleep disorder or a cause for the patient's sleep-related symptoms. Causes include (but are not limited to) the first night effect of the sleep study, circadian rhythm disturbances, medication effect or an underlying mood disorder or medical problem.  4. The patient should be cautioned not to drive, work at heights, or operate dangerous or heavy equipment when tired or sleepy. Review and reiteration of good sleep hygiene measures should be pursued with any patient. 5. The patient can follow-up with his referring provider, who will be notified of the test results.  I certify that I have reviewed the entire raw data recording prior to the issuance of this report in accordance with the Standards of Accreditation of the American Academy of Sleep Medicine (AASM)     Star Age, MD, PhD Diplomat, American Board of Psychiatry and Neurology (Neurology and Sleep Medicine)

## 2017-03-19 ENCOUNTER — Telehealth: Payer: Self-pay

## 2017-03-19 NOTE — Telephone Encounter (Signed)
Spoke with patient and gave him results of sleep study. Explained he did not have sleep apnea and we sent a copy to his PCP and Dr. Leta Baptist. He will follow up with his PCP provider. He understood the results.

## 2017-03-20 ENCOUNTER — Ambulatory Visit (INDEPENDENT_AMBULATORY_CARE_PROVIDER_SITE_OTHER): Payer: BLUE CROSS/BLUE SHIELD

## 2017-03-20 DIAGNOSIS — Z23 Encounter for immunization: Secondary | ICD-10-CM | POA: Diagnosis not present

## 2017-03-20 NOTE — Progress Notes (Signed)
Patient received his 2nd shingrix injection today at 8.15am. Given by Rosealee Albee CMA.

## 2017-09-18 ENCOUNTER — Other Ambulatory Visit: Payer: Self-pay | Admitting: Dermatology

## 2018-02-01 ENCOUNTER — Other Ambulatory Visit: Payer: Self-pay | Admitting: Family Medicine

## 2018-02-25 ENCOUNTER — Other Ambulatory Visit: Payer: Self-pay | Admitting: Family Medicine

## 2018-06-25 ENCOUNTER — Ambulatory Visit: Payer: BLUE CROSS/BLUE SHIELD | Admitting: Family Medicine

## 2018-06-25 ENCOUNTER — Encounter: Payer: Self-pay | Admitting: Family Medicine

## 2018-06-25 VITALS — BP 128/80 | HR 66 | Temp 98.3°F | Wt 182.2 lb

## 2018-06-25 DIAGNOSIS — M722 Plantar fascial fibromatosis: Secondary | ICD-10-CM | POA: Insufficient documentation

## 2018-06-25 NOTE — Patient Instructions (Signed)
Motrin 600 mg twice daily with food  Elevation and ice........ 3 times daily  Stretching exercises prior to bedtime  We'll get you set up for a PT consult ASAP

## 2018-06-25 NOTE — Progress Notes (Signed)
Jerry [name] is a 67 year old single male nonsmoker who comes in today for evaluation of pain in his left heel for 8 weeks with no history of trauma.  He states he injured that ankle 45 years ago he broke his tibia. It healed well he had no sequelae.  8 weeks ago he began having pain in his left heel. Hurts worse in the morning when he tries to walk. Again no history of trauma.  Vs BP 128/80 (BP Location: Left Arm, Patient Position: Sitting, Cuff Size: Normal)   Pulse 66   Temp 98.3 F (36.8 C) (Oral)   Wt 182 lb 3 oz (82.6 kg)   SpO2 98%   BMI 25.41 kg/m  Well-developed well-nourished male no acute distress vital signs stable he is afebrile examination of foot..........Marland Kitchen Appears normal pulses normal skin normal palpable tenderness in the plantar fascia  #1  Fasciitis............ Begin anti-inflammatories......... PT consult

## 2018-07-08 IMAGING — CT CT HEAD W/O CM
3 of 4 series · 14 of 47 positions shown, 16 images · non-contrast
Comparison: None

CLINICAL DATA: Left-sided weakness and facial numbness

EXAM:
CT HEAD WITHOUT CONTRAST
TECHNIQUE: Contiguous axial images were obtained from the base of the skull
through the vertex without intravenous contrast.

[Series 2: head w/o · axial · non-contrast · 0.45mm/px · z∈[-108,+12]mm · 8 of 30 slices shown, 10 images]
[im 3/30  brain]
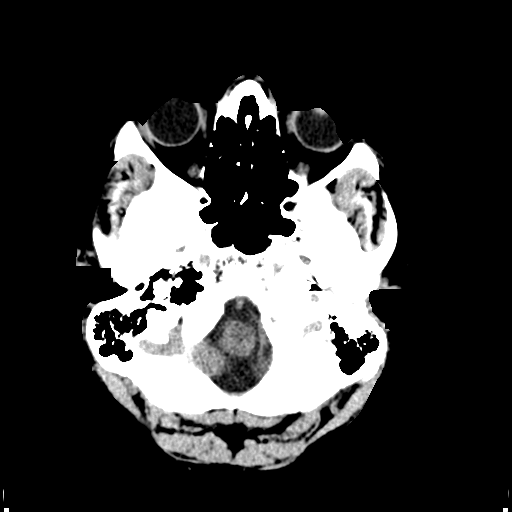
[im 3/30  bone]
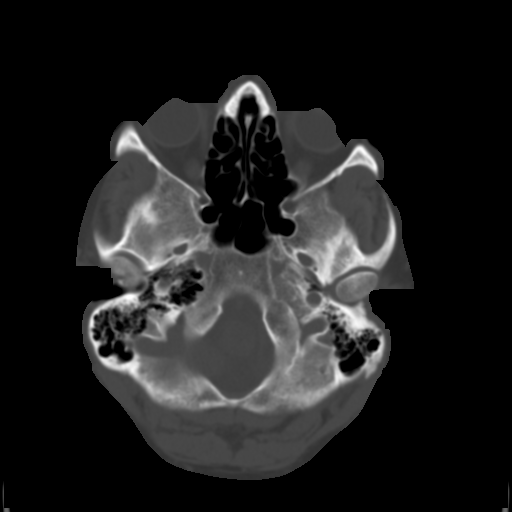
[im 7/30  brain]
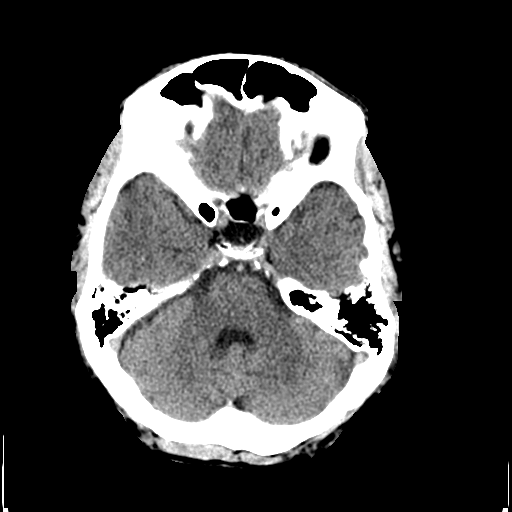
[im 11/30  brain]
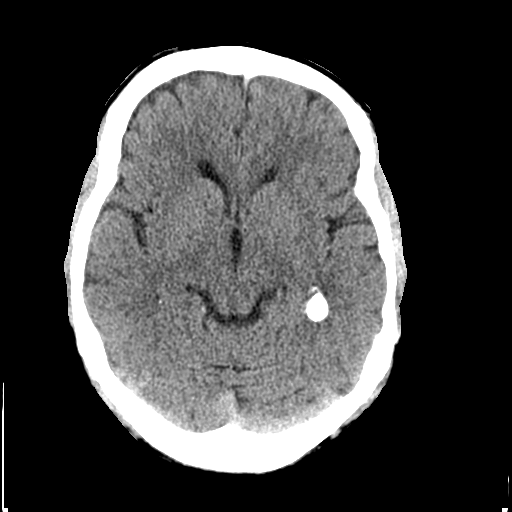
[im 13/30  brain]
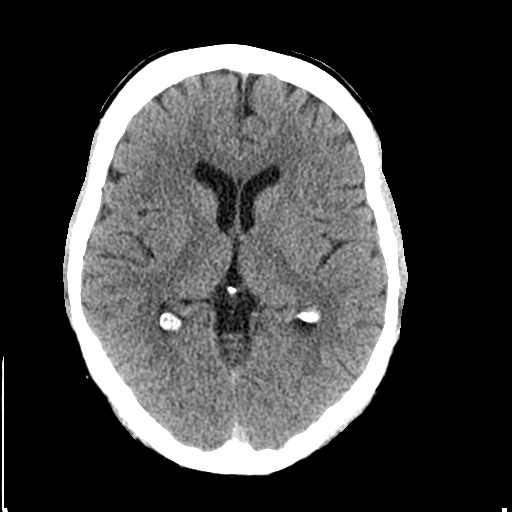
[im 17/30  brain]
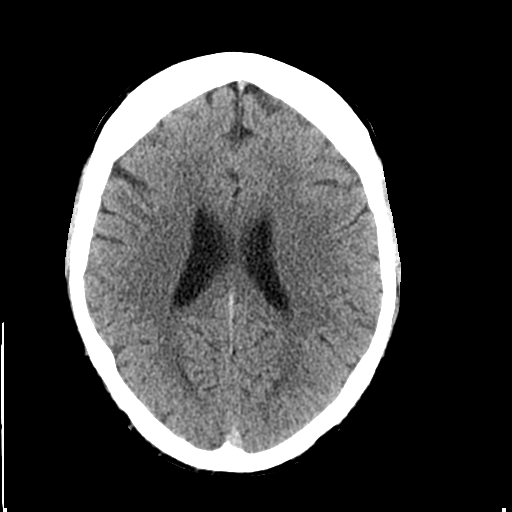
[im 17/30  bone]
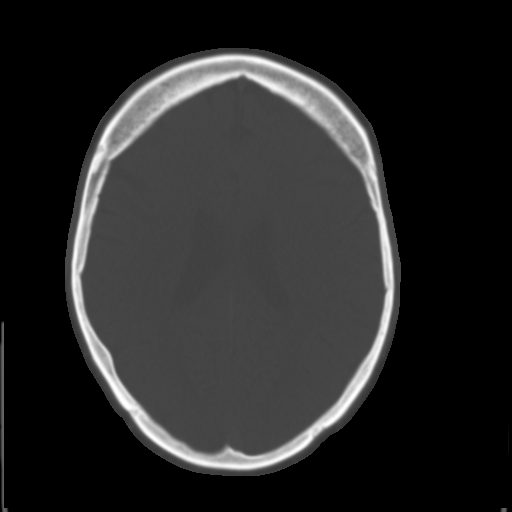
[im 19/30  brain]
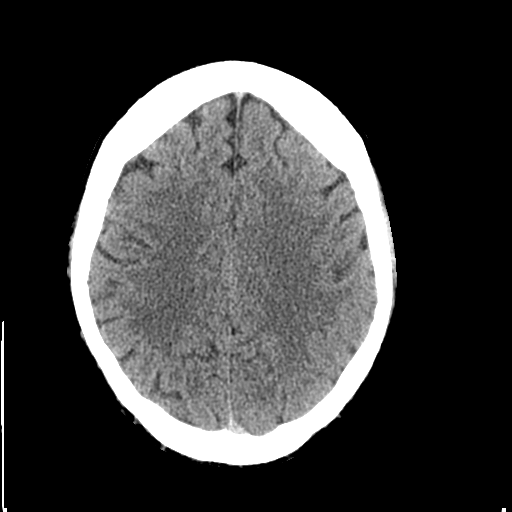
[im 23/30  brain]
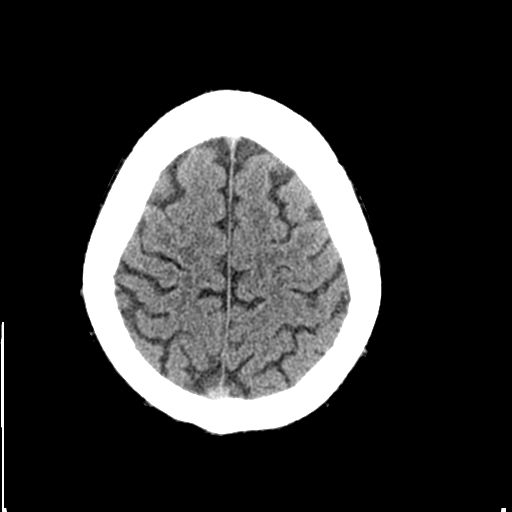
[im 27/30  brain]
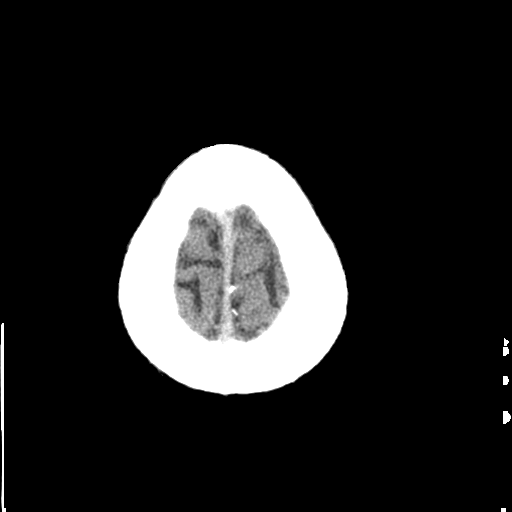

[Series 4: coronal · coronal · 0.29mm/px · 3 of 77 slices shown]
[im 26/77  brain]
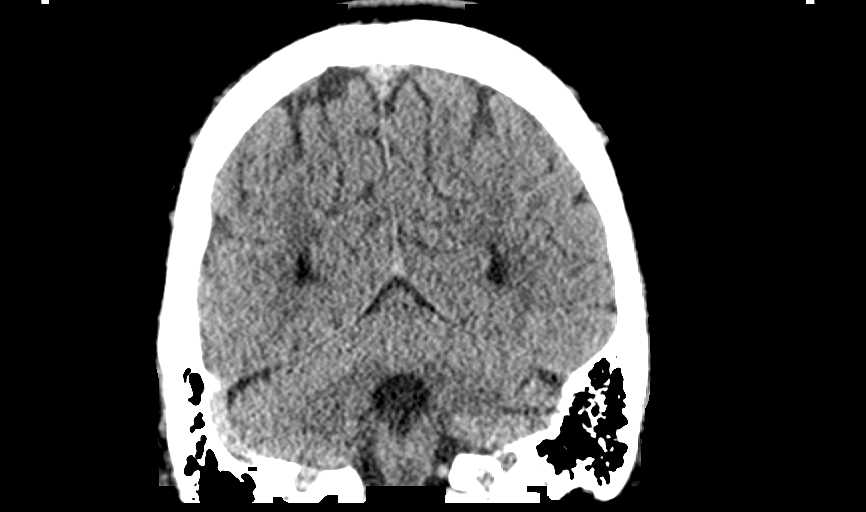
[im 34/77  brain]
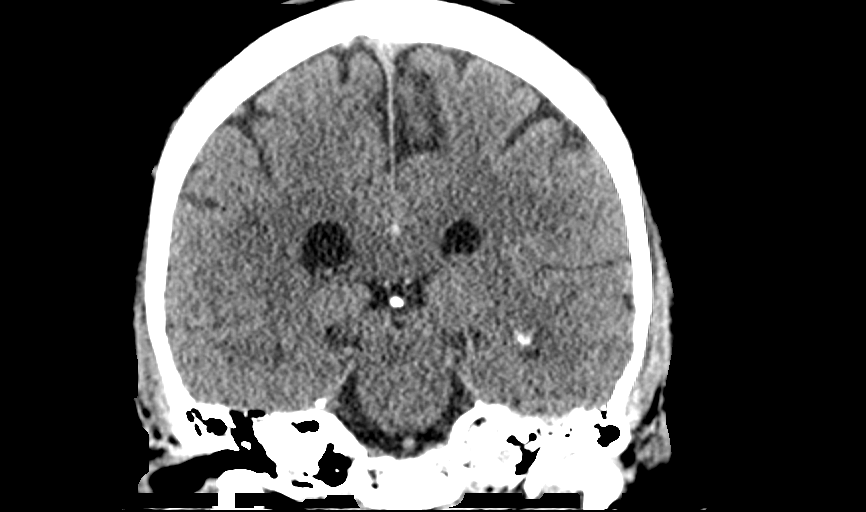
[im 43/77  brain]
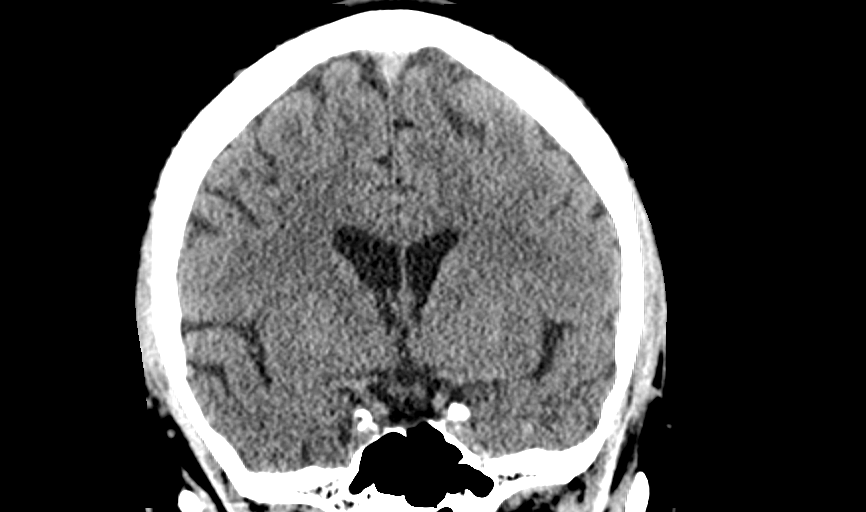

[Series 5: sagittal · sagittal · 0.29mm/px · 3 of 68 slices shown]
[im 23/68  brain]
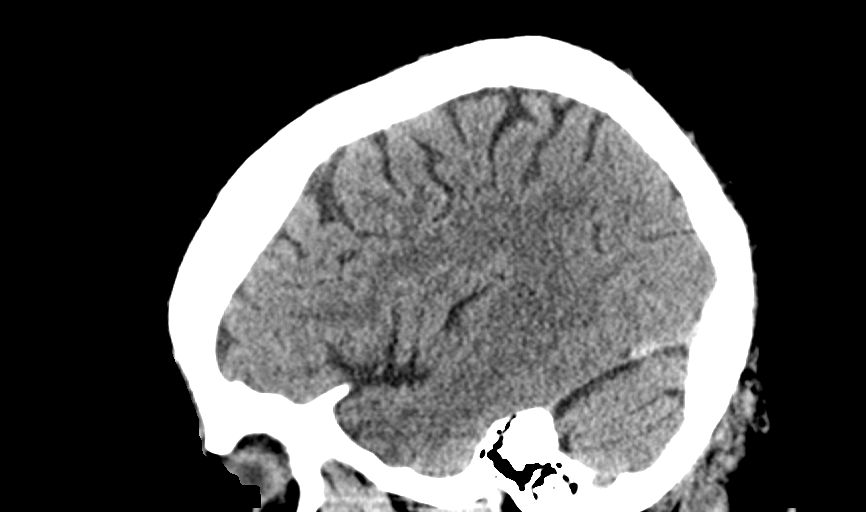
[im 34/68  brain]
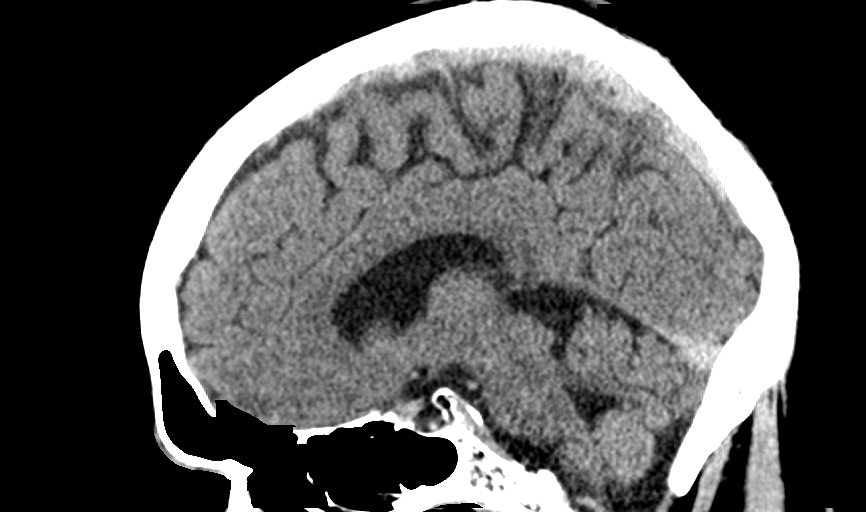
[im 45/68  brain]
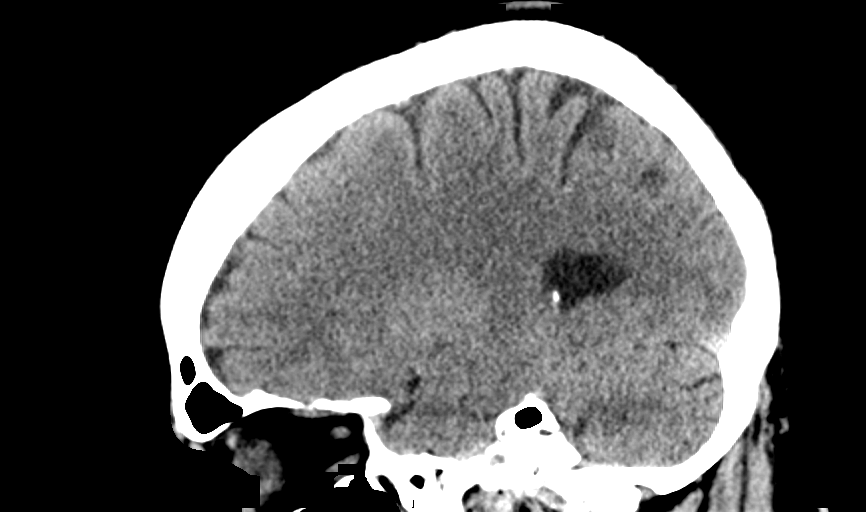

[14 of 47 positions shown; findings below may reference images not displayed]

FINDINGS: Brain: No evidence of acute infarction, hemorrhage, hydrocephalus,
extra-axial collection or mass lesion/mass effect. Mild
low-attenuation within the subcortical white matter of the frontal
lobes identified compatible with chronic microvascular disease.

Vascular: No hyperdense vessel or unexpected calcification.

Skull: Normal. Negative for fracture or focal lesion.

Sinuses/Orbits: No acute finding.

Other: None.
IMPRESSION: 1. No acute intracranial abnormality.

## 2018-09-16 ENCOUNTER — Ambulatory Visit (INDEPENDENT_AMBULATORY_CARE_PROVIDER_SITE_OTHER): Payer: BLUE CROSS/BLUE SHIELD

## 2018-09-16 ENCOUNTER — Ambulatory Visit: Payer: BLUE CROSS/BLUE SHIELD | Admitting: Internal Medicine

## 2018-09-16 ENCOUNTER — Encounter: Payer: Self-pay | Admitting: Internal Medicine

## 2018-09-16 VITALS — BP 128/70 | HR 75 | Temp 99.1°F | Wt 181.8 lb

## 2018-09-16 DIAGNOSIS — R0600 Dyspnea, unspecified: Secondary | ICD-10-CM

## 2018-09-16 DIAGNOSIS — R058 Other specified cough: Secondary | ICD-10-CM

## 2018-09-16 DIAGNOSIS — J209 Acute bronchitis, unspecified: Secondary | ICD-10-CM

## 2018-09-16 DIAGNOSIS — R05 Cough: Secondary | ICD-10-CM

## 2018-09-16 MED ORDER — HYDROCODONE-HOMATROPINE 5-1.5 MG/5ML PO SYRP: 5.0000 mL | ORAL_SOLUTION | Freq: Three times a day (TID) | ORAL | 0 refills | Status: DC | PRN

## 2018-09-16 MED ORDER — DOXYCYCLINE HYCLATE 100 MG PO TABS: 100.0000 mg | ORAL_TABLET | Freq: Two times a day (BID) | ORAL | 0 refills | Status: DC

## 2018-09-16 NOTE — Progress Notes (Signed)
Chief Complaint  Patient presents with  . Cough    x 1 week. Pt states that cough is keeping him up at night. productive with thick green mucous. Some wheezing, SOB, CP and Chest tightness. no fever. Has taken Robitussin, Mucinex, Flonase and NyQuil/DayQuil    HPI: Jerry [name] 67 y.o. come in for sda   Previous pcp   Todd   Had appt TOC  Later in the month  coughing more and more and upat night and  Almost   Vomiting  And  Thick green mucous .  Sometimes  2-3 tbls   No blood    worrisome   Somewhat short of breath   ? No fever .   Work in Facilities manager  Such as  nyquil dayquil and on regular flonase  Hx of bronchitis  ub remote.  slightly short of breath  ROS: See pertinent positives and negatives per HPI.  Does office work  Past Medical History:  Diagnosis Date  . Allergy   . Hyperlipidemia   . Hypogonadism male   . TIA (transient ischemic attack) 11/2016    Family History  Problem Relation Age of Onset  . Colon cancer Neg Hx     Social History   Socioeconomic History  . Marital status: Married    Spouse name: Not on file  . Number of children: 1  . Years of education: 2  . Highest education level: Not on file  Occupational History  . Not on file  Social Needs  . Financial resource strain: Not on file  . Food insecurity:    Worry: Not on file    Inability: Not on file  . Transportation needs:    Medical: Not on file    Non-medical: Not on file  Tobacco Use  . Smoking status: Former Research scientist (life sciences)  . Smokeless tobacco: Never Used  . Tobacco comment: Quit in 1972  Substance and Sexual Activity  . Alcohol use: Yes    Comment: occ, beer and wine  . Drug use: No  . Sexual activity: Not on file  Lifestyle  . Physical activity:    Days per week: Not on file    Minutes per session: Not on file  . Stress: Not on file  Relationships  . Social connections:    Talks on phone: Not on file    Gets together: Not on file    Attends religious service: Not on  file    Active member of club or organization: Not on file    Attends meetings of clubs or organizations: Not on file    Relationship status: Not on file  Other Topics Concern  . Not on file  Social History Narrative   Lives at home alone.  Works for [employer].  12 th grade education.  Drinks caffeine (coffee) daily.     Outpatient Medications Prior to Visit  Medication Sig Dispense Refill  . aspirin 81 MG tablet Take 81 mg by mouth daily.    Hinda Kehr 30 MG/ACT SOLN Place 1 application onto the skin daily.     . fluticasone (FLONASE) 50 MCG/ACT nasal spray Place 2 sprays into both nostrils daily as needed for rhinitis.    Marland Kitchen loratadine (CLARITIN) 10 MG tablet Take 10 mg by mouth daily as needed for allergies.     Marland Kitchen losartan (COZAAR) 50 MG tablet TAKE 1 TABLET BY MOUTH EVERY DAY 90 tablet 3  . Multiple Vitamin (MULTIVITAMIN WITH  MINERALS) TABS tablet Take 1 tablet by mouth daily.    . simvastatin (ZOCOR) 20 MG tablet TAKE 1 TABLET (20 MG TOTAL) BY MOUTH DAILY AT 6 PM. 100 tablet 2  . tadalafil (CIALIS) 20 MG tablet Take 10-20 mg by mouth daily as needed for erectile dysfunction.    Marland Kitchen tretinoin (RETIN-A) 0.26 % cream 1 APPLICATION THIN LAYER TO FACE AT BEDTIME  5   No facility-administered medications prior to visit.      EXAM:  BP 128/70 (BP Location: Right Arm, Patient Position: Sitting, Cuff Size: Normal)   Pulse 75   Temp 99.1 F (37.3 C) (Oral)   Wt 181 lb 12.8 oz (82.5 kg)   SpO2 98%   BMI 25.36 kg/m   Body mass index is 25.36 kg/m. WDWN in NAD  quiet respirations; mildly congested  somewhat hoarse. Non toxic . HEENT: Normocephalic ;atraumatic , Eyes;  PERRL, EOMs  Full, lids and conjunctiva clear,,Ears: no deformities, canals nl, TM landmarks normal, Nose: no deformity or discharge but congested;face non  tender Mouth : OP clear without lesion or edema . Neck: Supple without adenopathy or masses or bruits Chest:  Clear to A without wheezes rales or  rhonchi CV:  S1-S2 no gallops or murmurs peripheral perfusion is normal Skin :nl perfusion and no acute rashes   c xray   npna  Bronchitis changes   BP Readings from Last 3 Encounters:  09/16/18 128/70  06/25/18 128/80  02/20/17 116/76    ASSESSMENT AND PLAN:  Discussed the following assessment and plan:  Respiratory tract congestion with cough - Plan: DG Chest 2 View, DG Chest 2 View  Dyspnea, unspecified type - Plan: DG Chest 2 View, DG Chest 2 View  Acute bronchitis, unspecified organism   Impressive  Amount of   Infected  Mucous by report  And relapsing sx  Hx of bronchitisi in remote past    No asthma  Sx rx cough med  and if not improving add antibiotic       Expectant management.  And fu with alarm sx  -Patient advised to return or notify health care team  if  new concerns arise.  Patient Instructions  Your x ray shows no pneumonia  . Some bronchitis  Changes   Plan   Cough  Med  Antibiotics usually dont help   But if needed can add  Antibiotic  If not improved in another  3-4 days   The average cough can last 2-3 weeks but usually feel better  In a week or so     Acute Bronchitis, Adult Acute bronchitis is sudden (acute) swelling of the air tubes (bronchi) in the lungs. Acute bronchitis causes these tubes to fill with mucus, which can make it hard to breathe. It can also cause coughing or wheezing. In adults, acute bronchitis usually goes away within 2 weeks. A cough caused by bronchitis may last up to 3 weeks. Smoking, allergies, and asthma can make the condition worse. Repeated episodes of bronchitis may cause further lung problems, such as chronic obstructive pulmonary disease (COPD). What are the causes? This condition can be caused by germs and by substances that irritate the lungs, including:  Cold and flu viruses. This condition is most often caused by the same virus that causes a cold.  Bacteria.  Exposure to tobacco smoke, dust, fumes, and air  pollution.  What increases the risk? This condition is more likely to develop in people who:  Have close contact with someone  with acute bronchitis.  Are exposed to lung irritants, such as tobacco smoke, dust, fumes, and vapors.  Have a weak immune system.  Have a respiratory condition such as asthma.  What are the signs or symptoms? Symptoms of this condition include:  A cough.  Coughing up clear, yellow, or green mucus.  Wheezing.  Chest congestion.  Shortness of breath.  A fever.  Body aches.  Chills.  A sore throat.  How is this diagnosed? This condition is usually diagnosed with a physical exam. During the exam, your health care provider may order tests, such as chest X-rays, to rule out other conditions. He or she may also:  Test a sample of your mucus for bacterial infection.  Check the level of oxygen in your blood. This is done to check for pneumonia.  Do a chest X-ray or lung function testing to rule out pneumonia and other conditions.  Perform blood tests.  Your health care provider will also ask about your symptoms and medical history. How is this treated? Most cases of acute bronchitis clear up over time without treatment. Your health care provider may recommend:  Drinking more fluids. Drinking more makes your mucus thinner, which may make it easier to breathe.  Taking a medicine for a fever or cough.  Taking an antibiotic medicine.  Using an inhaler to help improve shortness of breath and to control a cough.  Using a cool mist vaporizer or humidifier to make it easier to breathe.  Follow these instructions at home: Medicines  Take over-the-counter and prescription medicines only as told by your health care provider.  If you were prescribed an antibiotic, take it as told by your health care provider. Do not stop taking the antibiotic even if you start to feel better. General instructions  Get plenty of rest.  Drink enough fluids to  keep your urine clear or pale yellow.  Avoid smoking and secondhand smoke. Exposure to cigarette smoke or irritating chemicals will make bronchitis worse. If you smoke and you need help quitting, ask your health care provider. Quitting smoking will help your lungs heal faster.  Use an inhaler, cool mist vaporizer, or humidifier as told by your health care provider.  Keep all follow-up visits as told by your health care provider. This is important. How is this prevented? To lower your risk of getting this condition again:  Wash your hands often with soap and water. If soap and water are not available, use hand sanitizer.  Avoid contact with people who have cold symptoms.  Try not to touch your hands to your mouth, nose, or eyes.  Make sure to get the flu shot every year.  Contact a health care provider if:  Your symptoms do not improve in 2 weeks of treatment. Get help right away if:  You cough up blood.  You have chest pain.  You have severe shortness of breath.  You become dehydrated.  You faint or keep feeling like you are going to faint.  You keep vomiting.  You have a severe headache.  Your fever or chills gets worse. This information is not intended to replace advice given to you by your health care provider. Make sure you discuss any questions you have with your health care provider. Document Released: 11/09/2004 Document Revised: 04/26/2016 Document Reviewed: 03/22/2016 Elsevier Interactive Patient Education  2018 Ionia Aunna Snooks M.D.

## 2018-09-16 NOTE — Patient Instructions (Signed)
Your x ray shows no pneumonia  . Some bronchitis  Changes   Plan   Cough  Med  Antibiotics usually dont help   But if needed can add  Antibiotic  If not improved in another  3-4 days   The average cough can last 2-3 weeks but usually feel better  In a week or so     Acute Bronchitis, Adult Acute bronchitis is sudden (acute) swelling of the air tubes (bronchi) in the lungs. Acute bronchitis causes these tubes to fill with mucus, which can make it hard to breathe. It can also cause coughing or wheezing. In adults, acute bronchitis usually goes away within 2 weeks. A cough caused by bronchitis may last up to 3 weeks. Smoking, allergies, and asthma can make the condition worse. Repeated episodes of bronchitis may cause further lung problems, such as chronic obstructive pulmonary disease (COPD). What are the causes? This condition can be caused by germs and by substances that irritate the lungs, including:  Cold and flu viruses. This condition is most often caused by the same virus that causes a cold.  Bacteria.  Exposure to tobacco smoke, dust, fumes, and air pollution.  What increases the risk? This condition is more likely to develop in people who:  Have close contact with someone with acute bronchitis.  Are exposed to lung irritants, such as tobacco smoke, dust, fumes, and vapors.  Have a weak immune system.  Have a respiratory condition such as asthma.  What are the signs or symptoms? Symptoms of this condition include:  A cough.  Coughing up clear, yellow, or green mucus.  Wheezing.  Chest congestion.  Shortness of breath.  A fever.  Body aches.  Chills.  A sore throat.  How is this diagnosed? This condition is usually diagnosed with a physical exam. During the exam, your health care provider may order tests, such as chest X-rays, to rule out other conditions. He or she may also:  Test a sample of your mucus for bacterial infection.  Check the level of  oxygen in your blood. This is done to check for pneumonia.  Do a chest X-ray or lung function testing to rule out pneumonia and other conditions.  Perform blood tests.  Your health care provider will also ask about your symptoms and medical history. How is this treated? Most cases of acute bronchitis clear up over time without treatment. Your health care provider may recommend:  Drinking more fluids. Drinking more makes your mucus thinner, which may make it easier to breathe.  Taking a medicine for a fever or cough.  Taking an antibiotic medicine.  Using an inhaler to help improve shortness of breath and to control a cough.  Using a cool mist vaporizer or humidifier to make it easier to breathe.  Follow these instructions at home: Medicines  Take over-the-counter and prescription medicines only as told by your health care provider.  If you were prescribed an antibiotic, take it as told by your health care provider. Do not stop taking the antibiotic even if you start to feel better. General instructions  Get plenty of rest.  Drink enough fluids to keep your urine clear or pale yellow.  Avoid smoking and secondhand smoke. Exposure to cigarette smoke or irritating chemicals will make bronchitis worse. If you smoke and you need help quitting, ask your health care provider. Quitting smoking will help your lungs heal faster.  Use an inhaler, cool mist vaporizer, or humidifier as told by your health care  provider.  Keep all follow-up visits as told by your health care provider. This is important. How is this prevented? To lower your risk of getting this condition again:  Wash your hands often with soap and water. If soap and water are not available, use hand sanitizer.  Avoid contact with people who have cold symptoms.  Try not to touch your hands to your mouth, nose, or eyes.  Make sure to get the flu shot every year.  Contact a health care provider if:  Your symptoms do  not improve in 2 weeks of treatment. Get help right away if:  You cough up blood.  You have chest pain.  You have severe shortness of breath.  You become dehydrated.  You faint or keep feeling like you are going to faint.  You keep vomiting.  You have a severe headache.  Your fever or chills gets worse. This information is not intended to replace advice given to you by your health care provider. Make sure you discuss any questions you have with your health care provider. Document Released: 11/09/2004 Document Revised: 04/26/2016 Document Reviewed: 03/22/2016 Elsevier Interactive Patient Education  [employer].  .

## 2018-10-11 ENCOUNTER — Ambulatory Visit: Payer: BLUE CROSS/BLUE SHIELD | Admitting: Family Medicine

## 2018-10-11 ENCOUNTER — Encounter: Payer: Self-pay | Admitting: Family Medicine

## 2018-10-11 VITALS — BP 120/68 | HR 80 | Temp 97.8°F | Ht 71.0 in | Wt 180.6 lb

## 2018-10-11 DIAGNOSIS — R7301 Impaired fasting glucose: Secondary | ICD-10-CM

## 2018-10-11 DIAGNOSIS — I10 Essential (primary) hypertension: Secondary | ICD-10-CM

## 2018-10-11 DIAGNOSIS — Z8673 Personal history of transient ischemic attack (TIA), and cerebral infarction without residual deficits: Secondary | ICD-10-CM | POA: Diagnosis not present

## 2018-10-11 DIAGNOSIS — Z23 Encounter for immunization: Secondary | ICD-10-CM | POA: Diagnosis not present

## 2018-10-11 DIAGNOSIS — E781 Pure hyperglyceridemia: Secondary | ICD-10-CM | POA: Diagnosis not present

## 2018-10-11 MED ORDER — ASPIRIN 81 MG PO TABS: 325.0000 mg | ORAL_TABLET | Freq: Every day | ORAL | Status: AC

## 2018-10-11 NOTE — Patient Instructions (Signed)
Increase Aspirin to 325mg  daily.

## 2018-10-11 NOTE — Progress Notes (Signed)
Jerry [name] DOB: 1950/11/15 Encounter date: 10/11/2018  This is a 67 y.o. male who presents to establish care. Chief Complaint  Patient presents with  . Transitions Of Care    History of present illness: No specific concerns today.   Sees dermatology as needed; follows for cancer screening. Has scheduled appointment in February.   Follows with urology yearly. Follows with them for testosterone levels. On the [place] now; feels less tired.   On zocor for cholesterol. No muscle cramps/aches.   Does not regularly check BP at home. On losartan for blood pressure.   Seasonal allergies: takes the claritin when needed; flonase when needed.   In 11/2016 was having tingling in arm, leg and head and worsened for awhile; kept putting off evaluation but eventually ended up in hospital overnight. Followed up with neurology and had some outpatient testing. Explanation he got was deep, smaller vessels were affected. Had tingling the other day in head, but went away. Not following with neurology at this point. Does take ASA daily.   Past Medical History:  Diagnosis Date  . Allergy   . Hyperlipidemia   . Hypertension   . Hypogonadism male   . TIA (transient ischemic attack) 11/2016   Past Surgical History:  Procedure Laterality Date  . CHOLECYSTECTOMY  2007   No Known Allergies Current Meds  Medication Sig  . aspirin 81 MG tablet Take 4 tablets (325 mg total) by mouth daily.  Jerry [name] 30 MG/ACT SOLN Place 1 application onto the skin daily.   . fluticasone (FLONASE) 50 MCG/ACT nasal spray Place 2 sprays into both nostrils daily as needed for rhinitis.  Marland Kitchen loratadine (CLARITIN) 10 MG tablet Take 10 mg by mouth daily as needed for allergies.   Marland Kitchen losartan (COZAAR) 50 MG tablet TAKE 1 TABLET BY MOUTH EVERY DAY  . Multiple Vitamin (MULTIVITAMIN WITH MINERALS) TABS tablet Take 1 tablet by mouth daily.  . simvastatin (ZOCOR) 20 MG tablet TAKE 1 TABLET (20 MG TOTAL) BY MOUTH DAILY AT 6 PM.  .  tadalafil (CIALIS) 20 MG tablet Take 10-20 mg by mouth daily as needed for erectile dysfunction.  Marland Kitchen tretinoin (RETIN-A) 1.61 % cream 1 APPLICATION THIN LAYER TO FACE AT BEDTIME  . [DISCONTINUED] aspirin 81 MG tablet Take 81 mg by mouth daily.  . [DISCONTINUED] doxycycline (VIBRA-TABS) 100 MG tablet Take 1 tablet (100 mg total) by mouth 2 (two) times daily.  . [DISCONTINUED] HYDROcodone-homatropine (HYCODAN) 5-1.5 MG/5ML syrup Take 5 mLs by mouth every 8 (eight) hours as needed for cough.   Social History   Tobacco Use  . Smoking status: Former Research scientist (life sciences)  . Smokeless tobacco: Never Used  . Tobacco comment: Quit in 1972  Substance Use Topics  . Alcohol use: Yes    Comment: occ, beer and wine   Family History  Problem Relation Age of Onset  . Diabetes Mother   . Diabetes Sister   . Colon cancer Neg Hx      Review of Systems  Constitutional: Negative for chills, fatigue and fever.  Respiratory: Negative for cough, chest tightness, shortness of breath and wheezing.   Cardiovascular: Negative for chest pain, palpitations and leg swelling.    Objective:  BP 120/68 (BP Location: Left Arm, Patient Position: Sitting, Cuff Size: Normal)   Pulse 80   Temp 97.8 F (36.6 C) (Oral)   Ht 5\' 11"  (1.803 m)   Wt 180 lb 9.6 oz (81.9 kg)   SpO2 98%   BMI 25.19 kg/m  Weight: 180 lb 9.6 oz (81.9 kg)   BP Readings from Last 3 Encounters:  10/11/18 120/68  09/16/18 128/70  06/25/18 128/80   Wt Readings from Last 3 Encounters:  10/11/18 180 lb 9.6 oz (81.9 kg)  09/16/18 181 lb 12.8 oz (82.5 kg)  06/25/18 182 lb 3 oz (82.6 kg)    Physical Exam Constitutional:      General: He is not in acute distress.    Appearance: He is well-developed.  Cardiovascular:     Rate and Rhythm: Normal rate and regular rhythm.     Heart sounds: Normal heart sounds. No murmur. No friction rub.     Comments: No lower extremity edema Pulmonary:     Effort: Pulmonary effort is normal. No respiratory  distress.     Breath sounds: Normal breath sounds. No wheezing or rales.  Neurological:     Mental Status: He is alert and oriented to person, place, and time.  Psychiatric:        Behavior: Behavior normal.     Assessment/Plan: 1. Hypertension, unspecified type Controlled; continue losartan. - Comprehensive metabolic panel; Future - CBC with Differential/Platelet; Future  2. HYPERTRIGLYCERIDEMIA Continue zocor. Recheck labs fasting for baseline TG. - Lipid panel; Future - TSH; Future  3. Elevated fasting glucose Stable previous A1C. - Hemoglobin A1c; Future  4. Need for pneumococcal vaccination - Pneumococcal conjugate vaccine 13-valent IM   Return in about 6 months (around 04/12/2019) for physical exam.  Micheline Rough, MD

## 2018-10-14 ENCOUNTER — Other Ambulatory Visit (INDEPENDENT_AMBULATORY_CARE_PROVIDER_SITE_OTHER): Payer: BLUE CROSS/BLUE SHIELD

## 2018-10-14 DIAGNOSIS — R7301 Impaired fasting glucose: Secondary | ICD-10-CM | POA: Diagnosis not present

## 2018-10-14 DIAGNOSIS — I10 Essential (primary) hypertension: Secondary | ICD-10-CM

## 2018-10-14 DIAGNOSIS — E781 Pure hyperglyceridemia: Secondary | ICD-10-CM | POA: Diagnosis not present

## 2018-10-14 LAB — LIPID PANEL
Cholesterol: 162 mg/dL (ref 0–200)
HDL: 33.9 mg/dL — ABNORMAL LOW (ref >39.00)
LDL Cholesterol: 91 mg/dL (ref 0–99)
NonHDL: 128.35
Total CHOL/HDL Ratio: 5
Triglycerides: 186 mg/dL — ABNORMAL HIGH (ref 0.0–149.0)
VLDL: 37.2 mg/dL (ref 0.0–40.0)

## 2018-10-14 LAB — CBC WITH DIFFERENTIAL/PLATELET
Basophils Absolute: 0 10*3/uL (ref 0.0–0.1)
Basophils Relative: 0.9 % (ref 0.0–3.0)
Eosinophils Absolute: 0.2 10*3/uL (ref 0.0–0.7)
Eosinophils Relative: 3.4 % (ref 0.0–5.0)
HCT: 39.3 % (ref 39.0–52.0)
Hemoglobin: 13.7 g/dL (ref 13.0–17.0)
Lymphocytes Relative: 21.3 % (ref 12.0–46.0)
Lymphs Abs: 1 10*3/uL (ref 0.7–4.0)
MCHC: 34.7 g/dL (ref 30.0–36.0)
MCV: 87.1 fl (ref 78.0–100.0)
Monocytes Absolute: 0.5 10*3/uL (ref 0.1–1.0)
Monocytes Relative: 10 % (ref 3.0–12.0)
NEUTROS PCT: 64.4 % (ref 43.0–77.0)
Neutro Abs: 3 10*3/uL (ref 1.4–7.7)
Platelets: 285 10*3/uL (ref 150.0–400.0)
RBC: 4.52 Mil/uL (ref 4.22–5.81)
RDW: 13.3 % (ref 11.5–15.5)
WBC: 4.6 10*3/uL (ref 4.0–10.5)

## 2018-10-14 LAB — COMPREHENSIVE METABOLIC PANEL
ALT: 31 U/L (ref 0–53)
AST: 21 U/L (ref 0–37)
Albumin: 4.1 g/dL (ref 3.5–5.2)
Alkaline Phosphatase: 68 U/L (ref 39–117)
BUN: 22 mg/dL (ref 6–23)
CO2: 25 mEq/L (ref 19–32)
Calcium: 9 mg/dL (ref 8.4–10.5)
Chloride: 105 mEq/L (ref 96–112)
Creatinine, Ser: 0.98 mg/dL (ref 0.40–1.50)
GFR: 81.31 mL/min (ref >60.00)
Glucose, Bld: 103 mg/dL — ABNORMAL HIGH (ref 70–99)
POTASSIUM: 4.2 meq/L (ref 3.5–5.1)
SODIUM: 138 meq/L (ref 135–145)
Total Bilirubin: 0.4 mg/dL (ref 0.2–1.2)
Total Protein: 6.5 g/dL (ref 6.0–8.3)

## 2018-10-14 LAB — HEMOGLOBIN A1C: Hgb A1c MFr Bld: 5.6 % (ref 4.6–6.5)

## 2018-10-14 LAB — TSH: TSH: 7.15 u[IU]/mL — ABNORMAL HIGH (ref 0.35–4.50)

## 2018-10-22 MED ORDER — LEVOTHYROXINE SODIUM 50 MCG PO TABS: 50.0000 ug | ORAL_TABLET | Freq: Every day | ORAL | 1 refills | Status: DC

## 2018-11-18 ENCOUNTER — Other Ambulatory Visit: Payer: Self-pay | Admitting: Family Medicine

## 2018-11-21 ENCOUNTER — Other Ambulatory Visit: Payer: Self-pay

## 2018-11-21 MED ORDER — SIMVASTATIN 20 MG PO TABS: 20.0000 mg | ORAL_TABLET | Freq: Every day | ORAL | 2 refills | Status: DC

## 2018-11-21 NOTE — Telephone Encounter (Signed)
Last Fill 02/02/18 by Dr.Todd. Last OV 09/2718  Ok to fill?

## 2018-12-11 ENCOUNTER — Ambulatory Visit: Payer: BLUE CROSS/BLUE SHIELD | Admitting: Family Medicine

## 2018-12-11 ENCOUNTER — Encounter: Payer: Self-pay | Admitting: Family Medicine

## 2018-12-11 ENCOUNTER — Ambulatory Visit (HOSPITAL_COMMUNITY)
Admission: RE | Admit: 2018-12-11 | Discharge: 2018-12-11 | Disposition: A | Discharge status: Home / Self Care | Payer: BLUE CROSS/BLUE SHIELD | Source: Ambulatory Visit | Attending: Family Medicine | Admitting: Family Medicine

## 2018-12-11 VITALS — BP 120/62 | HR 69 | Temp 98.1°F | Ht 70.0 in | Wt 186.3 lb

## 2018-12-11 DIAGNOSIS — G629 Polyneuropathy, unspecified: Secondary | ICD-10-CM | POA: Insufficient documentation

## 2018-12-11 DIAGNOSIS — E039 Hypothyroidism, unspecified: Secondary | ICD-10-CM

## 2018-12-11 DIAGNOSIS — R519 Headache, unspecified: Secondary | ICD-10-CM

## 2018-12-11 DIAGNOSIS — I10 Essential (primary) hypertension: Secondary | ICD-10-CM

## 2018-12-11 DIAGNOSIS — M722 Plantar fascial fibromatosis: Secondary | ICD-10-CM | POA: Diagnosis not present

## 2018-12-11 DIAGNOSIS — R51 Headache: Secondary | ICD-10-CM | POA: Diagnosis not present

## 2018-12-11 LAB — SEDIMENTATION RATE: Sed Rate: 5 mm/hr (ref 0–20)

## 2018-12-11 MED ORDER — LEVOTHYROXINE SODIUM 50 MCG PO TABS: 50.0000 ug | ORAL_TABLET | Freq: Every day | ORAL | 1 refills | Status: DC

## 2018-12-11 MED ORDER — LOSARTAN POTASSIUM 25 MG PO TABS: 50.0000 mg | ORAL_TABLET | Freq: Every day | ORAL | 1 refills | Status: DC

## 2018-12-11 NOTE — Patient Instructions (Signed)
We are working on scheduling MRI right now and I will call you once I see results.   I will also call you with lab results. If you have worsening of symptoms with head pain in meanwhile please proceed to ER.

## 2018-12-11 NOTE — Progress Notes (Signed)
Jerry [name] DOB: 01/19/1951 Encounter date: 12/11/2018  This is a 68 y.o. male who presents with Chief Complaint  Patient presents with  . Follow-up    History of present illness: Last seen 10/11/18: Was noted to have hypothyroid on blood work.  He was started on Synthroid, the pharmacy did not refill this medication for him.  See below.  Did feel like energy was possibly better when he took the synthroid for 30 days. Felt a little sluggish after coming off medication.   Pharmacy declined to refill his medication even though there was refill listed on this?   Was getting sharp pain top of head with tingling right side of face. Did some googling and thought maybe nerve related. Had this last week and then previous 2 weeks. Happening every day sometimes multiple times/day. Noted it with driving, at work. Not happening with activity. Sharp pain at top of head lasts 5-10 second, then tingling down side of face about 20-30 seconds. Then symptoms are all gone. Maybe just a subtle lefover numbness. Has been better in last week. Last noted on last Sunday (10 days). Did get some pain in eyes (more in left than right) with episodes. Vision and eyes feel ok now.       No Known Allergies Current Meds  Medication Sig  . aspirin 81 MG tablet Take 4 tablets (325 mg total) by mouth daily.  Hinda Kehr 30 MG/ACT SOLN Place 1 application onto the skin daily.   . fluticasone (FLONASE) 50 MCG/ACT nasal spray Place 2 sprays into both nostrils daily as needed for rhinitis.  Marland Kitchen levothyroxine (SYNTHROID, LEVOTHROID) 50 MCG tablet Take 1 tablet (50 mcg total) by mouth daily.  Marland Kitchen loratadine (CLARITIN) 10 MG tablet Take 10 mg by mouth daily as needed for allergies.   . Multiple Vitamin (MULTIVITAMIN WITH MINERALS) TABS tablet Take 1 tablet by mouth daily.  . simvastatin (ZOCOR) 20 MG tablet Take 1 tablet (20 mg total) by mouth daily at 6 PM.  . tadalafil (CIALIS) 20 MG tablet Take 10-20 mg by mouth daily as  needed for erectile dysfunction.  Marland Kitchen tretinoin (RETIN-A) 1.09 % cream 1 APPLICATION THIN LAYER TO FACE AT BEDTIME  . [DISCONTINUED] levothyroxine (SYNTHROID, LEVOTHROID) 50 MCG tablet Take 1 tablet (50 mcg total) by mouth daily.  . [DISCONTINUED] losartan (COZAAR) 50 MG tablet TAKE 1 TABLET BY MOUTH EVERY DAY    Review of Systems  Constitutional: Negative for chills, fatigue and fever.  Respiratory: Negative for cough, chest tightness, shortness of breath and wheezing.   Cardiovascular: Negative for chest pain, palpitations and leg swelling.  Neurological: Positive for dizziness (about 10 seconds with head pain episodes), numbness and headaches. Negative for tremors, syncope, speech difficulty and weakness.  Psychiatric/Behavioral: Nervous/anxious: he is anxious about head pain sx he is having.     Objective:  BP 120/62 (BP Location: Left Arm, Patient Position: Sitting, Cuff Size: Normal)   Pulse 69   Temp 98.1 F (36.7 C) (Oral)   Ht 5\' 10"  (1.778 m)   Wt 186 lb 4.8 oz (84.5 kg)   SpO2 96%   BMI 26.73 kg/m   Weight: 186 lb 4.8 oz (84.5 kg)   BP Readings from Last 3 Encounters:  12/11/18 120/62  10/11/18 120/68  09/16/18 128/70   Wt Readings from Last 3 Encounters:  12/11/18 186 lb 4.8 oz (84.5 kg)  10/11/18 180 lb 9.6 oz (81.9 kg)  09/16/18 181 lb 12.8 oz (82.5 kg)    Physical  Exam Constitutional:      General: He is not in acute distress.    Appearance: He is well-developed. He is not diaphoretic.  HENT:     Head: Normocephalic and atraumatic.     Right Ear: External ear normal.     Left Ear: External ear normal.  Eyes:     Conjunctiva/sclera: Conjunctivae normal.     Pupils: Pupils are equal, round, and reactive to light.  Neck:     Musculoskeletal: Neck supple.     Thyroid: No thyromegaly.  Cardiovascular:     Rate and Rhythm: Normal rate and regular rhythm.     Heart sounds: Normal heart sounds. No murmur. No friction rub. No gallop.   Pulmonary:     Effort:  Pulmonary effort is normal. No respiratory distress.     Breath sounds: Normal breath sounds. No wheezing or rales.  Lymphadenopathy:     Cervical: No cervical adenopathy.  Skin:    General: Skin is warm and dry.  Neurological:     General: No focal deficit present.     Mental Status: He is alert and oriented to person, place, and time.     Cranial Nerves: No cranial nerve deficit.     Motor: No weakness or abnormal muscle tone.     Deep Tendon Reflexes: Reflexes normal.     Reflex Scores:      Tricep reflexes are 2+ on the right side and 2+ on the left side.      Bicep reflexes are 2+ on the right side and 2+ on the left side.      Brachioradialis reflexes are 2+ on the right side and 2+ on the left side.      Patellar reflexes are 2+ on the right side and 2+ on the left side. Psychiatric:        Behavior: Behavior normal.     Assessment/Plan 1. Hypothyroidism, unspecified type He has been off his medication for 1 month's time.  We will repeat blood work today and restart Synthroid pending these results. - TSH; Future - levothyroxine (SYNTHROID, LEVOTHROID) 50 MCG tablet; Take 1 tablet (50 mcg total) by mouth daily.  Dispense: 90 tablet; Refill: 1 - T3, free - T4, free; Future - T4, free - TSH  2. Hypertension, unspecified type Stable.  Continue current medication.  25 mg tablets were sent in because the pharmacy is out of stock of the 50 mg tablets.  When this needs to be refilled it is okay to switch back to 50 mg tablet. - losartan (COZAAR) 25 MG tablet; Take 2 tablets (50 mg total) by mouth daily.  Dispense: 180 tablet; Refill: 1  3. Plantar fasciitis of left foot Discussed importance of stretching calf and heel on a regular basis.  Continue with rolling massage of heel.  Continue with supportive shoes. - Ambulatory referral to Podiatry  4. Acute nonintractable headache, unspecified headache type I am going to refer to neurology due to severity of his symptoms when they  occur.  Initially I was feeling reassured that he had not had any symptoms in over a week, but then while he was sitting on the exam table he had another episode of head pain with right slight facial numbness that resolved within seconds.  Because of this I am going to repeat an MRI of the brain today.  I would still like for him to see neurology to discuss the symptoms.  Of note, I did review his CTA completed approximately 2  years ago in which her vessels looked normal, so I have low concern that this is coming from some sort of aneurysm.  I would like to make sure there are no other deficits on MRI that may be related. - Sedimentation rate; Future - Ambulatory referral to Neurology - [place]; Future - Sedimentation rate  5. Neuropathy See above - MR BRAIN WO CONTRAST; Future    Return pending labwork and imaging results.    Micheline Rough, MD

## 2018-12-12 LAB — T4, FREE: FREE T4: 0.85 ng/dL (ref 0.60–1.60)

## 2018-12-12 LAB — T3, FREE: T3, Free: 4.1 pg/mL (ref 2.3–4.2)

## 2018-12-12 LAB — TSH: TSH: 4.6 u[IU]/mL — ABNORMAL HIGH (ref 0.35–4.50)

## 2019-01-20 ENCOUNTER — Telehealth: Payer: Self-pay | Admitting: Family Medicine

## 2019-01-20 NOTE — Telephone Encounter (Signed)
Will send to Dr. Koberlein as FYI 

## 2019-01-20 NOTE — Telephone Encounter (Signed)
Pt given results per Dr Micheline Rough, 12/11/2018 "His thyroid is back closer to this borderline. I'm going to leave treatment up to him at this point! We could certainly watch for now and recheck everything in 6 months time. He might be borderline and just jump back and forth between normal/abnormal. He felt somewhat better with the synthroid. So if he would like to resume at previous dose that is ok too and I would recommend TSH repeat in 3 months time then."; the pt says that he feels fine, the synthorid is ok, and will keep his 01/29/2019 appointment, and at that time they will discuss lab draws.; unable to chart in result note due to extended amount of time to contact pt.

## 2019-01-21 NOTE — Telephone Encounter (Signed)
Noted  

## 2019-01-29 ENCOUNTER — Other Ambulatory Visit: Payer: Self-pay

## 2019-01-29 ENCOUNTER — Ambulatory Visit (INDEPENDENT_AMBULATORY_CARE_PROVIDER_SITE_OTHER): Payer: BLUE CROSS/BLUE SHIELD | Admitting: Family Medicine

## 2019-01-29 DIAGNOSIS — Z1159 Encounter for screening for other viral diseases: Secondary | ICD-10-CM

## 2019-01-29 DIAGNOSIS — E039 Hypothyroidism, unspecified: Secondary | ICD-10-CM

## 2019-01-29 DIAGNOSIS — L309 Dermatitis, unspecified: Secondary | ICD-10-CM

## 2019-01-29 DIAGNOSIS — F528 Other sexual dysfunction not due to a substance or known physiological condition: Secondary | ICD-10-CM | POA: Diagnosis not present

## 2019-01-29 DIAGNOSIS — R7989 Other specified abnormal findings of blood chemistry: Secondary | ICD-10-CM

## 2019-01-29 DIAGNOSIS — I10 Essential (primary) hypertension: Secondary | ICD-10-CM

## 2019-01-29 DIAGNOSIS — E781 Pure hyperglyceridemia: Secondary | ICD-10-CM

## 2019-01-29 NOTE — Progress Notes (Signed)
Virtual Visit via Video Note  I connected with@ on 01/29/19 at  8:00 AM EDT by a video enabled telemedicine application and verified that I am speaking with the correct person using two identifiers.  Location patient: home Location provider:work or home office Persons participating in the virtual visit: patient, provider  I discussed the limitations of evaluation and management by telemedicine and the availability of in person appointments. The patient expressed understanding and agreed to proceed.   Jerry [name] DOB: [birth year] Encounter date: 01/29/2019  This is a 68 y.o. male who presents with Chief Complaint  Patient presents with  . Follow-up    headaches have stopped, states that it feels more like a tingle every once in a while    History of present illness: TSH due end of May for recheck   HPI  No longer having headaches; still getting tingling in head about 3-4 times/week. Tingling lasts about 10 seconds. Nothing associated with it - no headache, vision changes.   Did restart the synthroid. Tolerating this well. No side effects from medication.   Feels well overall. Stays pretty active with work; climbing stairs frequently. Gets up and walks every 10 minutes at work. He is eating healthy overall.   HTN: not checking at home. Tolerating medication without difficulty.   Taking cholesterol medication daily.   Follows with urology regularly - on testosterone and cialis for low testosterone and erectile dysfunction.  Follows with dermatology regularly.    No Known Allergies Current Meds  Medication Sig  . aspirin 81 MG tablet Take 4 tablets (325 mg total) by mouth daily.  Hinda Kehr 30 MG/ACT SOLN Place 1 application onto the skin daily.   . fluticasone (FLONASE) 50 MCG/ACT nasal spray Place 2 sprays into both nostrils daily as needed for rhinitis.  Marland Kitchen levothyroxine (SYNTHROID, LEVOTHROID) 50 MCG tablet Take 1 tablet (50 mcg total) by mouth daily.  Marland Kitchen loratadine  (CLARITIN) 10 MG tablet Take 10 mg by mouth daily as needed for allergies.   Marland Kitchen losartan (COZAAR) 25 MG tablet Take 2 tablets (50 mg total) by mouth daily.  . Multiple Vitamin (MULTIVITAMIN WITH MINERALS) TABS tablet Take 1 tablet by mouth daily.  . simvastatin (ZOCOR) 20 MG tablet Take 1 tablet (20 mg total) by mouth daily at 6 PM.  . tadalafil (CIALIS) 20 MG tablet Take 10-20 mg by mouth daily as needed for erectile dysfunction.  Marland Kitchen tretinoin (RETIN-A) 3.50 % cream 1 APPLICATION THIN LAYER TO FACE AT BEDTIME    Review of Systems  Constitutional: Negative for chills, fatigue and fever.  Respiratory: Negative for cough, chest tightness, shortness of breath and wheezing.   Cardiovascular: Negative for chest pain, palpitations and leg swelling.  Neurological: Negative for dizziness, speech difficulty, light-headedness, numbness and headaches.    Objective:  There were no vitals taken for this visit.      BP Readings from Last 3 Encounters:  12/11/18 120/62  10/11/18 120/68  09/16/18 128/70   Wt Readings from Last 3 Encounters:  12/11/18 186 lb 4.8 oz (84.5 kg)  10/11/18 180 lb 9.6 oz (81.9 kg)  09/16/18 181 lb 12.8 oz (82.5 kg)    EXAM:  GENERAL: alert, oriented, appears well and in no acute distress  HEENT: atraumatic, conjunctiva clear, no obvious abnormalities on inspection of external nose and ears  NECK: normal movements of the head and neck  LUNGS: on inspection no signs of respiratory distress, breathing rate appears normal, no obvious gross SOB, gasping or wheezing  CV: no obvious cyanosis  MS: moves all visible extremities without noticeable abnormality  PSYCH/NEURO: pleasant and cooperative, no obvious depression or anxiety, speech and thought processing grossly intact  Assessment/Plan 1. Hypertension, unspecified type Stable; continue current meds. Check at home to monitor baseline/trends (once weekly is fine) - Comprehensive metabolic panel; Future - CBC with  Differential/Platelet; Future  2. Hypothyroidism, unspecified type Cont synthroid; recheck bloodwork in July. 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 Continue with axiron.   4. ERECTILE DYSFUNCTION, MILD Cialis. Follows with urology.  5. HYPERTRIGLYCERIDEMIA Continue with simvastatin. - Lipid panel; Future  6. Dermatitis Follows with dermatology.   7. Encounter for hepatitis C screening test for low risk patient  - Hep C Antibody     I discussed the assessment and treatment plan with the patient. The patient was provided an opportunity to ask questions and all were answered. The patient agreed with the plan and demonstrated an understanding of the instructions.   The patient was advised to call back or seek an in-person evaluation if the symptoms worsen or if the condition fails to improve as anticipated.  Return physical in Fall; bloodwork in July (he will call to schedule).  Micheline Rough, MD

## 2019-05-15 ENCOUNTER — Other Ambulatory Visit: Payer: Self-pay

## 2019-05-15 ENCOUNTER — Other Ambulatory Visit (INDEPENDENT_AMBULATORY_CARE_PROVIDER_SITE_OTHER): Payer: BC Managed Care – PPO

## 2019-05-15 DIAGNOSIS — I10 Essential (primary) hypertension: Secondary | ICD-10-CM

## 2019-05-15 DIAGNOSIS — E781 Pure hyperglyceridemia: Secondary | ICD-10-CM

## 2019-05-15 DIAGNOSIS — E039 Hypothyroidism, unspecified: Secondary | ICD-10-CM | POA: Diagnosis not present

## 2019-05-15 DIAGNOSIS — Z1159 Encounter for screening for other viral diseases: Secondary | ICD-10-CM

## 2019-05-15 LAB — COMPREHENSIVE METABOLIC PANEL
ALT: 49 U/L (ref 0–53)
AST: 30 U/L (ref 0–37)
Albumin: 4.6 g/dL (ref 3.5–5.2)
Alkaline Phosphatase: 79 U/L (ref 39–117)
BUN: 14 mg/dL (ref 6–23)
CO2: 27 mEq/L (ref 19–32)
Calcium: 9.4 mg/dL (ref 8.4–10.5)
Chloride: 105 mEq/L (ref 96–112)
Creatinine, Ser: 1.15 mg/dL (ref 0.40–1.50)
GFR: 63.49 mL/min (ref >60.00)
Glucose, Bld: 107 mg/dL — ABNORMAL HIGH (ref 70–99)
Potassium: 4.6 mEq/L (ref 3.5–5.1)
Sodium: 140 mEq/L (ref 135–145)
Total Bilirubin: 0.4 mg/dL (ref 0.2–1.2)
Total Protein: 7.3 g/dL (ref 6.0–8.3)

## 2019-05-15 LAB — CBC WITH DIFFERENTIAL/PLATELET
Basophils Absolute: 0.1 10*3/uL (ref 0.0–0.1)
Basophils Relative: 1.4 % (ref 0.0–3.0)
Eosinophils Absolute: 0.1 10*3/uL (ref 0.0–0.7)
Eosinophils Relative: 2.5 % (ref 0.0–5.0)
HCT: 42.5 % (ref 39.0–52.0)
Hemoglobin: 14.7 g/dL (ref 13.0–17.0)
Lymphocytes Relative: 24.4 % (ref 12.0–46.0)
Lymphs Abs: 1 10*3/uL (ref 0.7–4.0)
MCHC: 34.6 g/dL (ref 30.0–36.0)
MCV: 87.6 fl (ref 78.0–100.0)
Monocytes Absolute: 0.4 10*3/uL (ref 0.1–1.0)
Monocytes Relative: 9.7 % (ref 3.0–12.0)
Neutro Abs: 2.5 10*3/uL (ref 1.4–7.7)
Neutrophils Relative %: 62 % (ref 43.0–77.0)
Platelets: 332 10*3/uL (ref 150.0–400.0)
RBC: 4.86 Mil/uL (ref 4.22–5.81)
RDW: 13.5 % (ref 11.5–15.5)
WBC: 4 10*3/uL (ref 4.0–10.5)

## 2019-05-15 LAB — LIPID PANEL
Cholesterol: 170 mg/dL (ref 0–200)
HDL: 29.6 mg/dL — ABNORMAL LOW (ref >39.00)
NonHDL: 140.7
Total CHOL/HDL Ratio: 6
Triglycerides: 381 mg/dL — ABNORMAL HIGH (ref 0.0–149.0)
VLDL: 76.2 mg/dL — ABNORMAL HIGH (ref 0.0–40.0)

## 2019-05-15 LAB — TSH: TSH: 2.19 u[IU]/mL (ref 0.35–4.50)

## 2019-05-15 LAB — LDL CHOLESTEROL, DIRECT: Direct LDL: 80 mg/dL

## 2019-05-16 LAB — HEPATITIS C ANTIBODY
Hepatitis C Ab: NONREACTIVE
SIGNAL TO CUT-OFF: 0.36 (ref <1.00)

## 2019-05-22 ENCOUNTER — Telehealth: Payer: Self-pay | Admitting: Family Medicine

## 2019-05-22 NOTE — Telephone Encounter (Signed)
Pt given lab results and recommendations per notes of Dr. Ethlyn Gallery on 05/19/19. Pt verbalized understanding. Pt will need a return call to schedule appt in October. Unable to document in result note.

## 2019-05-23 NOTE — Telephone Encounter (Signed)
Scheduled appointment for July 21, 2019 at 9 AM

## 2019-05-23 NOTE — Telephone Encounter (Signed)
Please call and schedule appointment.

## 2019-06-08 ENCOUNTER — Other Ambulatory Visit: Payer: Self-pay | Admitting: Family Medicine

## 2019-06-08 DIAGNOSIS — I10 Essential (primary) hypertension: Secondary | ICD-10-CM

## 2019-06-08 DIAGNOSIS — E039 Hypothyroidism, unspecified: Secondary | ICD-10-CM

## 2019-07-16 ENCOUNTER — Other Ambulatory Visit: Payer: Self-pay

## 2019-07-16 ENCOUNTER — Telehealth (INDEPENDENT_AMBULATORY_CARE_PROVIDER_SITE_OTHER): Payer: BC Managed Care – PPO | Admitting: Internal Medicine

## 2019-07-16 DIAGNOSIS — R509 Fever, unspecified: Secondary | ICD-10-CM

## 2019-07-16 DIAGNOSIS — M791 Myalgia, unspecified site: Secondary | ICD-10-CM | POA: Diagnosis not present

## 2019-07-16 DIAGNOSIS — R05 Cough: Secondary | ICD-10-CM

## 2019-07-16 DIAGNOSIS — R5383 Other fatigue: Secondary | ICD-10-CM | POA: Diagnosis not present

## 2019-07-16 DIAGNOSIS — Z20822 Contact with and (suspected) exposure to covid-19: Secondary | ICD-10-CM

## 2019-07-16 NOTE — Progress Notes (Signed)
Virtual Visit via Video Note  I connected with Jerry [name] on 07/16/19 at  2:00 PM EDT by a video enabled telemedicine application and verified that I am speaking with the correct person using two identifiers.  Location patient: home Location provider: work office Persons participating in the virtual visit: patient, provider  I discussed the limitations of evaluation and management by telemedicine and the availability of in person appointments. The patient expressed understanding and agreed to proceed.   HPI: Acute visit for URI symptoms.  While at work yesterday had dry cough all day. Temp this am was 99.8, he feels flushed and has severe myalgias. His legs are very sore. He called and the [facility] directed him to [facility] to get tested which he already has. He was told results would be available within 48 hours. Feels very fatigued. No SOB.   ROS: Constitutional: Positive for fever, chills, diaphoresis, appetite change and fatigue.  HEENT: Denies photophobia, eye pain, redness, hearing loss, ear pain, congestion, sore throat, rhinorrhea, sneezing, mouth sores, trouble swallowing, neck pain, neck stiffness and tinnitus.   Respiratory: Denies SOB, DOE,  chest tightness,  and wheezing.   Cardiovascular: Denies chest pain, palpitations and leg swelling.  Gastrointestinal: Denies nausea, vomiting, abdominal pain, diarrhea, constipation, blood in stool and abdominal distention.  Genitourinary: Denies dysuria, urgency, frequency, hematuria, flank pain and difficulty urinating.  Endocrine: Denies: hot or cold intolerance, changes in hair or nails, polyuria, polydipsia. Musculoskeletal: Denies  back pain, joint swelling, arthralgias and gait problem.  Skin: Denies pallor, rash and wound.  Neurological: Denies dizziness, seizures, syncope, weakness, light-headedness, numbness and headaches.  Hematological: Denies adenopathy. Easy bruising, personal or family bleeding history   Psychiatric/Behavioral: Denies suicidal ideation, mood changes, confusion, nervousness, sleep disturbance and agitation   Past Medical History:  Diagnosis Date  . Allergy   . Hyperlipidemia   . Hypertension   . Hypogonadism male   . TIA (transient ischemic attack) 11/2016    Past Surgical History:  Procedure Laterality Date  . CHOLECYSTECTOMY  2007    Family History  Problem Relation Age of Onset  . Diabetes Mother   . Diabetes Sister   . Colon cancer Neg Hx     SOCIAL HX:   reports that he has quit smoking. He has never used smokeless tobacco. He reports current alcohol use. He reports that he does not use drugs.   Current Outpatient Medications:  .  aspirin 81 MG tablet, Take 4 tablets (325 mg total) by mouth daily., Disp: 30 tablet, Rfl:  .  AXIRON 30 MG/ACT SOLN, Place 1 application onto the skin daily. , Disp: , Rfl:  .  fluticasone (FLONASE) 50 MCG/ACT nasal spray, Place 2 sprays into both nostrils daily as needed for rhinitis., Disp: , Rfl:  .  levothyroxine (SYNTHROID) 50 MCG tablet, TAKE 1 TABLET BY MOUTH EVERY DAY, Disp: 90 tablet, Rfl: 1 .  loratadine (CLARITIN) 10 MG tablet, Take 10 mg by mouth daily as needed for allergies. , Disp: , Rfl:  .  losartan (COZAAR) 25 MG tablet, TAKE 2 TABLETS BY MOUTH EVERY DAY, Disp: 180 tablet, Rfl: 1 .  Multiple Vitamin (MULTIVITAMIN WITH MINERALS) TABS tablet, Take 1 tablet by mouth daily., Disp: , Rfl:  .  simvastatin (ZOCOR) 20 MG tablet, Take 1 tablet (20 mg total) by mouth daily at 6 PM., Disp: 100 tablet, Rfl: 2 .  tadalafil (CIALIS) 20 MG tablet, Take 10-20 mg by mouth daily as needed for erectile dysfunction., Disp: ,  Rfl:  .  tretinoin (RETIN-A) AB-123456789 % cream, 1 APPLICATION THIN LAYER TO FACE AT BEDTIME, Disp: , Rfl: 5  EXAM:   VITALS per patient if applicable: none reported  GENERAL: alert, oriented, appears well and in no acute distress  HEENT: atraumatic, conjunttiva clear, no obvious abnormalities on inspection  of external nose and ears  NECK: normal movements of the head and neck  LUNGS: on inspection no signs of respiratory distress, breathing rate appears normal, no obvious gross increased work of breathing, gasping or wheezing  CV: no obvious cyanosis  MS: moves all visible extremities without noticeable abnormality  PSYCH/NEURO: pleasant and cooperative, no obvious depression or anxiety, speech and thought processing grossly intact  ASSESSMENT AND PLAN:   Suspected Covid-19 Virus Infection  -Already went to get tested today by orders of the [facility]. -Advised UC if fevers, SOB or unrelenting cough, otherwise ok to self isolate at home and use OTC meds.   I discussed the assessment and treatment plan with the patient. The patient was provided an opportunity to ask questions and all were answered. The patient agreed with the plan and demonstrated an understanding of the instructions.   The patient was advised to call back or seek an in-person evaluation if the symptoms worsen or if the condition fails to improve as anticipated.    Lelon Frohlich, MD  [facility]

## 2019-07-17 LAB — NOVEL CORONAVIRUS, NAA: SARS-CoV-2, NAA: DETECTED — AB

## 2019-07-21 ENCOUNTER — Other Ambulatory Visit: Payer: Self-pay

## 2019-07-21 ENCOUNTER — Telehealth (INDEPENDENT_AMBULATORY_CARE_PROVIDER_SITE_OTHER): Payer: BC Managed Care – PPO | Admitting: Family Medicine

## 2019-07-21 DIAGNOSIS — U071 COVID-19: Secondary | ICD-10-CM | POA: Diagnosis not present

## 2019-07-21 NOTE — Patient Instructions (Signed)
Stay in isolation for at least 10 days since symptoms first appeared AND 24 hours without fever/use of fever reducing medications and other symptoms are improving.   Let us know if any concerns, worsening of symptoms.

## 2019-07-21 NOTE — Progress Notes (Signed)
Virtual Visit via Video Note  I connected with Kateri Plummer  on 07/21/19 at  9:00 AM EDT by a video enabled telemedicine application and verified that I am speaking with the correct person using two identifiers.  Location patient: home Location provider:work or home office Persons participating in the virtual visit: patient, provider  I discussed the limitations of evaluation and management by telemedicine and the availability of in person appointments. The patient expressed understanding and agreed to proceed.   Kateri Plummer DOB: August 15, 1951 Encounter date: 07/21/2019  This is a 68 y.o. male who presents with Chief Complaint  Patient presents with  . Follow-up    History of present illness: COVID testing from 9/30 was +. Had left office due to temp of 99. Went for COVID testing. That night temp up to 102. Then dropped back down. Then has stayed afebrile since then. Just felt a little stuffy, nothing unbearable. Little nausea this morning. Cough on and off, not significant. Not wheezy. Rarely will note a little shortness of breath.   Appetite not like it normally is. Little achy this morning, but hasn't been. Has had diarrhea since last weds. Trying to drink a lot of fluids. Going multiple times/day. Thinks he is doing ok with staying hydrated. Has some gatorade.   Right now feeling as good as he could be expected.   Not checking blood pressures. Doesn't have cuff.   HPI   No Known Allergies Current Meds  Medication Sig  . aspirin 81 MG tablet Take 4 tablets (325 mg total) by mouth daily.  Hinda Kehr 30 MG/ACT SOLN Place 1 application onto the skin daily.   . fluticasone (FLONASE) 50 MCG/ACT nasal spray Place 2 sprays into both nostrils daily as needed for rhinitis.  Marland Kitchen levothyroxine (SYNTHROID) 50 MCG tablet TAKE 1 TABLET BY MOUTH EVERY DAY  . loratadine (CLARITIN) 10 MG tablet Take 10 mg by mouth daily as needed for allergies.   Marland Kitchen losartan (COZAAR) 25 MG tablet TAKE 2 TABLETS  BY MOUTH EVERY DAY  . Multiple Vitamin (MULTIVITAMIN WITH MINERALS) TABS tablet Take 1 tablet by mouth daily.  . simvastatin (ZOCOR) 20 MG tablet Take 1 tablet (20 mg total) by mouth daily at 6 PM.  . tadalafil (CIALIS) 20 MG tablet Take 10-20 mg by mouth daily as needed for erectile dysfunction.  Marland Kitchen tretinoin (RETIN-A) AB-123456789 % cream 1 APPLICATION THIN LAYER TO FACE AT BEDTIME    Review of Systems  Constitutional: Negative for chills, fatigue and fever.  HENT: Positive for congestion and postnasal drip. Negative for ear pain, sinus pressure, sinus pain and sore throat.   Respiratory: Positive for cough and shortness of breath (minimal). Negative for chest tightness and wheezing.   Cardiovascular: Negative for chest pain, palpitations and leg swelling.    Objective:  There were no vitals taken for this visit.      BP Readings from Last 3 Encounters:  12/11/18 120/62  10/11/18 120/68  09/16/18 128/70   Wt Readings from Last 3 Encounters:  12/11/18 186 lb 4.8 oz (84.5 kg)  10/11/18 180 lb 9.6 oz (81.9 kg)  09/16/18 181 lb 12.8 oz (82.5 kg)    EXAM:  GENERAL: alert, oriented, appears well and in no acute distress  HEENT: atraumatic, conjunctiva clear, no obvious abnormalities on inspection of external nose and ears  NECK: normal movements of the head and neck  LUNGS: on inspection no signs of respiratory distress, breathing rate appears normal, no obvious gross SOB, gasping  or wheezing  CV: no obvious cyanosis  MS: moves all visible extremities without noticeable abnormality  PSYCH/NEURO: pleasant and cooperative, no obvious depression or anxiety, speech and thought processing grossly intact   Assessment/Plan  1. Lab test positive for detection of COVID-19 virus Doing well so far. Advised him to avoid dairy since this will worsen diarrhea. Keep up with fluids; add in gatorade daily. Rest. Let us know if any worsening of symptoms. [place] so he has access to additional care if needed.   Also recommended starting flonase to help control allergy symptoms that may contribute to his cough/congestion.   Return if symptoms worsen or fail to improve.    I discussed the assessment and treatment plan with the patient. The patient was provided an opportunity to ask questions and all were answered. The patient agreed with the plan and demonstrated an understanding of the instructions.   The patient was advised to call back or seek an in-person evaluation if the symptoms worsen or if the condition fails to improve as anticipated.  I provided 15 minutes of non-face-to-face time during this encounter.   Micheline Rough, MD

## 2019-08-19 DIAGNOSIS — C44310 Basal cell carcinoma of skin of unspecified parts of face: Secondary | ICD-10-CM

## 2019-08-19 HISTORY — DX: Basal cell carcinoma of skin of unspecified parts of face: C44.310

## 2019-08-26 ENCOUNTER — Telehealth: Payer: Self-pay | Admitting: *Deleted

## 2019-08-26 ENCOUNTER — Other Ambulatory Visit: Payer: Self-pay | Admitting: Family Medicine

## 2019-08-26 DIAGNOSIS — I10 Essential (primary) hypertension: Secondary | ICD-10-CM

## 2019-08-26 MED ORDER — LOSARTAN POTASSIUM 50 MG PO TABS: 50.0000 mg | ORAL_TABLET | Freq: Every day | ORAL | 1 refills | Status: DC

## 2019-08-26 NOTE — Telephone Encounter (Signed)
[facility] faxed a refill request for Losartan Potassium 50mg -take 1 tablet by mouth every day-#90 with 3 refills.  Message sent to Dr Ethlyn Gallery due to request for different dose and sigs.

## 2019-09-08 ENCOUNTER — Encounter: Payer: Self-pay | Admitting: Family Medicine

## 2019-12-11 ENCOUNTER — Other Ambulatory Visit: Payer: Self-pay | Admitting: Family Medicine

## 2019-12-11 DIAGNOSIS — I10 Essential (primary) hypertension: Secondary | ICD-10-CM

## 2019-12-15 ENCOUNTER — Other Ambulatory Visit: Payer: Self-pay | Admitting: Family Medicine

## 2019-12-16 ENCOUNTER — Other Ambulatory Visit: Payer: Self-pay | Admitting: Family Medicine

## 2019-12-16 ENCOUNTER — Telehealth: Payer: Self-pay | Admitting: Family Medicine

## 2019-12-16 DIAGNOSIS — E039 Hypothyroidism, unspecified: Secondary | ICD-10-CM

## 2019-12-16 NOTE — Telephone Encounter (Signed)
Medication Refill: Levothyroxine  Pharmacy: [facility]  Phone:

## 2019-12-17 MED ORDER — LEVOTHYROXINE SODIUM 50 MCG PO TABS: 50.0000 ug | ORAL_TABLET | Freq: Every day | ORAL | 0 refills | Status: DC

## 2019-12-17 NOTE — Telephone Encounter (Signed)
Rx done. 

## 2019-12-22 ENCOUNTER — Other Ambulatory Visit: Payer: Self-pay | Admitting: Family Medicine

## 2019-12-22 DIAGNOSIS — I10 Essential (primary) hypertension: Secondary | ICD-10-CM

## 2020-01-20 ENCOUNTER — Other Ambulatory Visit: Payer: Self-pay | Admitting: Family Medicine

## 2020-01-20 DIAGNOSIS — E039 Hypothyroidism, unspecified: Secondary | ICD-10-CM

## 2020-02-11 ENCOUNTER — Other Ambulatory Visit: Payer: Self-pay | Admitting: Family Medicine

## 2020-02-11 DIAGNOSIS — I10 Essential (primary) hypertension: Secondary | ICD-10-CM

## 2020-02-11 DIAGNOSIS — E039 Hypothyroidism, unspecified: Secondary | ICD-10-CM

## 2020-03-15 ENCOUNTER — Other Ambulatory Visit: Payer: Self-pay | Admitting: Family Medicine

## 2020-03-15 DIAGNOSIS — E039 Hypothyroidism, unspecified: Secondary | ICD-10-CM

## 2020-03-15 DIAGNOSIS — I10 Essential (primary) hypertension: Secondary | ICD-10-CM

## 2020-04-17 ENCOUNTER — Other Ambulatory Visit: Payer: Self-pay | Admitting: Family Medicine

## 2020-04-17 DIAGNOSIS — I10 Essential (primary) hypertension: Secondary | ICD-10-CM

## 2020-04-17 DIAGNOSIS — E039 Hypothyroidism, unspecified: Secondary | ICD-10-CM

## 2020-04-28 ENCOUNTER — Encounter: Payer: Self-pay | Admitting: Family Medicine

## 2020-04-28 DIAGNOSIS — C4491 Basal cell carcinoma of skin, unspecified: Secondary | ICD-10-CM | POA: Insufficient documentation

## 2020-05-19 ENCOUNTER — Other Ambulatory Visit: Payer: Self-pay | Admitting: Family Medicine

## 2020-05-19 DIAGNOSIS — I10 Essential (primary) hypertension: Secondary | ICD-10-CM

## 2020-05-19 DIAGNOSIS — E039 Hypothyroidism, unspecified: Secondary | ICD-10-CM

## 2020-06-04 ENCOUNTER — Other Ambulatory Visit: Payer: Self-pay | Admitting: Family Medicine

## 2020-06-04 DIAGNOSIS — E039 Hypothyroidism, unspecified: Secondary | ICD-10-CM

## 2020-06-04 DIAGNOSIS — I10 Essential (primary) hypertension: Secondary | ICD-10-CM

## 2020-07-06 ENCOUNTER — Other Ambulatory Visit: Payer: Self-pay | Admitting: Family Medicine

## 2020-07-06 DIAGNOSIS — E039 Hypothyroidism, unspecified: Secondary | ICD-10-CM

## 2020-07-06 DIAGNOSIS — I10 Essential (primary) hypertension: Secondary | ICD-10-CM

## 2020-07-20 ENCOUNTER — Other Ambulatory Visit: Payer: Self-pay | Admitting: Family Medicine

## 2020-07-20 DIAGNOSIS — E039 Hypothyroidism, unspecified: Secondary | ICD-10-CM

## 2020-07-20 DIAGNOSIS — I10 Essential (primary) hypertension: Secondary | ICD-10-CM

## 2020-07-30 ENCOUNTER — Encounter: Payer: Self-pay | Admitting: Family Medicine

## 2020-07-30 ENCOUNTER — Other Ambulatory Visit: Payer: Self-pay

## 2020-07-30 ENCOUNTER — Ambulatory Visit (INDEPENDENT_AMBULATORY_CARE_PROVIDER_SITE_OTHER): Payer: Medicare PPO | Admitting: Family Medicine

## 2020-07-30 VITALS — BP 110/80 | HR 69 | Temp 98.4°F | Ht 70.5 in | Wt 172.0 lb

## 2020-07-30 DIAGNOSIS — Z0000 Encounter for general adult medical examination without abnormal findings: Secondary | ICD-10-CM | POA: Diagnosis not present

## 2020-07-30 DIAGNOSIS — Z23 Encounter for immunization: Secondary | ICD-10-CM | POA: Diagnosis not present

## 2020-07-30 DIAGNOSIS — I10 Essential (primary) hypertension: Secondary | ICD-10-CM

## 2020-07-30 DIAGNOSIS — E781 Pure hyperglyceridemia: Secondary | ICD-10-CM

## 2020-07-30 DIAGNOSIS — E039 Hypothyroidism, unspecified: Secondary | ICD-10-CM

## 2020-07-30 DIAGNOSIS — G5601 Carpal tunnel syndrome, right upper limb: Secondary | ICD-10-CM

## 2020-07-30 DIAGNOSIS — R7989 Other specified abnormal findings of blood chemistry: Secondary | ICD-10-CM | POA: Diagnosis not present

## 2020-07-30 MED ORDER — LOSARTAN POTASSIUM 50 MG PO TABS: 50.0000 mg | ORAL_TABLET | Freq: Every day | ORAL | 3 refills | Status: DC

## 2020-07-30 NOTE — Progress Notes (Signed)
Rx phoned to [facility] as [facility] system is down.

## 2020-07-30 NOTE — Patient Instructions (Signed)
To schedule COVID vaccine or find out more information:  [email or web address]

## 2020-07-30 NOTE — Progress Notes (Signed)
Jerry [name] DOB: 11-Dec-1950 Encounter date: 07/30/2020  This is a 69 y.o. male who presents for complete physical   History of present illness/Additional concerns: Hasn't been trying to lose weight; but has been eating healthier. Has lost 14lb since last visit. Cut out breads, fast foods, taking multivitamin in morning as well. Just staying active. Does a lot of yard work, walking. Eating a lot of blueberries. Energy level is good, no fevers, chills, night sweats.   In last 4-5 weeks right hand is numb from middle finger to thumb. Takes 5-10 minutes to get feeling back. Tosses and turns; but sometimes right side sleeper. Doesn't bother him other times in day. No weakness in fingers. No pain in neck, elbow, shoulder, wrist. No swelling.   HL: still taking zocor 20mg ; no problems with this.   HTN: losartan 50mg  daily. Not checking at home. No issues with light headedness/diziness.   Allergies: doing ok - takes claritin when needed.   Hypothyroid: synthroid 16mcg daily.   Sees dermatology regularly, q 5-6 months. Doxy for face.   Sees Dr. Eulogio Ditch regularly - on axiron. Saw him 2 months ago. Prostate checks through him.   Did go to [facility] for lower back - therapy, injections.    Past Medical History:  Diagnosis Date  . Allergy   . Basal cell carcinoma (BCC) of face 08/19/2019   removed by skin surgery center Dr. Pearline Cables  . Hyperlipidemia   . Hypertension   . Hypogonadism male   . TIA (transient ischemic attack) 11/2016   Past Surgical History:  Procedure Laterality Date  . CHOLECYSTECTOMY  2007   No Known Allergies Current Meds  Medication Sig  . aspirin 81 MG tablet Take 4 tablets (325 mg total) by mouth daily. (Patient taking differently: Take 81 mg by mouth daily. )  . AXIRON 30 MG/ACT SOLN Place 1 application onto the skin daily.   Marland Kitchen doxycycline (DORYX) 100 MG EC tablet Take 100 mg by mouth daily.  . fluticasone (FLONASE) 50 MCG/ACT nasal spray Place 2 sprays  into both nostrils daily as needed for rhinitis.  Marland Kitchen levothyroxine (SYNTHROID) 50 MCG tablet TAKE 1 TABLET BY MOUTH EVERY DAY  . loratadine (CLARITIN) 10 MG tablet Take 10 mg by mouth daily as needed for allergies.   . Multiple Vitamin (MULTIVITAMIN WITH MINERALS) TABS tablet Take 1 tablet by mouth daily.  . simvastatin (ZOCOR) 20 MG tablet TAKE 1 TABLET (20 MG TOTAL) BY MOUTH DAILY AT 6 PM.  . tadalafil (CIALIS) 20 MG tablet Take 10-20 mg by mouth daily as needed for erectile dysfunction.  Marland Kitchen tretinoin (RETIN-A) 3.38 % cream 1 APPLICATION THIN LAYER TO FACE AT BEDTIME  . [DISCONTINUED] losartan (COZAAR) 25 MG tablet TAKE 2 TABLETS BY MOUTH EVERY DAY   Social History   Tobacco Use  . Smoking status: Former Research scientist (life sciences)  . Smokeless tobacco: Never Used  . Tobacco comment: Quit in 1972  Substance Use Topics  . Alcohol use: Yes    Comment: occ, beer and wine   Family History  Problem Relation Age of Onset  . Diabetes Mother   . Heart attack Father 50  . Diabetes Sister   . Alcohol abuse Brother   . Lupus Sister   . Colon cancer Neg Hx      Review of Systems  Constitutional: Negative for activity change, appetite change, chills, fatigue, fever and unexpected weight change.  HENT: Negative for congestion, ear pain, hearing loss, sinus pressure, sinus pain, sore throat  and trouble swallowing.   Eyes: Negative for pain and visual disturbance.  Respiratory: Negative for cough, chest tightness, shortness of breath and wheezing.   Cardiovascular: Negative for chest pain, palpitations and leg swelling.  Gastrointestinal: Negative for abdominal distention, abdominal pain, blood in stool, constipation, diarrhea, nausea and vomiting.  Genitourinary: Negative for decreased urine volume, difficulty urinating, dysuria, penile pain and testicular pain.  Musculoskeletal: Negative for arthralgias, back pain and joint swelling.  Skin: Negative for rash.  Neurological: Negative for dizziness, weakness,  numbness and headaches.  Hematological: Negative for adenopathy. Does not bruise/bleed easily.  Psychiatric/Behavioral: Negative for agitation, sleep disturbance and suicidal ideas. The patient is not nervous/anxious.     CBC:  Lab Results  Component Value Date   WBC 4.0 05/15/2019   HGB 14.7 05/15/2019   HCT 42.5 05/15/2019   MCH 31.2 12/07/2016   MCHC 34.6 05/15/2019   RDW 13.5 05/15/2019   PLT 332.0 05/15/2019   CMP: Lab Results  Component Value Date   NA 140 05/15/2019   K 4.6 05/15/2019   CL 105 05/15/2019   CO2 27 05/15/2019   ANIONGAP 4 (L) 12/07/2016   GLUCOSE 107 (H) 05/15/2019   BUN 14 05/15/2019   CREATININE 1.15 05/15/2019   GFRAA >60 12/07/2016   CALCIUM 9.4 05/15/2019   PROT 7.3 05/15/2019   BILITOT 0.4 05/15/2019   ALKPHOS 79 05/15/2019   ALT 49 05/15/2019   AST 30 05/15/2019   LIPID: Lab Results  Component Value Date   CHOL 170 05/15/2019   TRIG 381.0 (H) 05/15/2019   HDL 29.60 (L) 05/15/2019   LDLCALC 91 10/14/2018    Objective:  BP 110/80 (BP Location: Left Arm, Patient Position: Sitting, Cuff Size: Normal)   Pulse 69   Temp 98.4 F (36.9 C) (Oral)   Ht 5' 10.5" (1.791 m)   Wt 172 lb (78 kg)   BMI 24.33 kg/m   Weight: 172 lb (78 kg)   BP Readings from Last 3 Encounters:  07/30/20 110/80  12/11/18 120/62  10/11/18 120/68   Wt Readings from Last 3 Encounters:  07/30/20 172 lb (78 kg)  12/11/18 186 lb 4.8 oz (84.5 kg)  10/11/18 180 lb 9.6 oz (81.9 kg)    Physical Exam Constitutional:      General: He is not in acute distress.    Appearance: He is well-developed.  HENT:     Head: Normocephalic and atraumatic.     Right Ear: External ear normal.     Left Ear: External ear normal.     Nose: Nose normal.     Mouth/Throat:     Pharynx: No oropharyngeal exudate.  Eyes:     Conjunctiva/sclera: Conjunctivae normal.     Pupils: Pupils are equal, round, and reactive to light.  Neck:     Thyroid: No thyromegaly.  Cardiovascular:      Rate and Rhythm: Normal rate and regular rhythm.     Heart sounds: Normal heart sounds. No murmur heard.  No friction rub. No gallop.   Pulmonary:     Effort: Pulmonary effort is normal. No respiratory distress.     Breath sounds: Normal breath sounds. No stridor. No wheezing or rales.  Abdominal:     General: Bowel sounds are normal.     Palpations: Abdomen is soft.  Musculoskeletal:        General: Normal range of motion.     Cervical back: Neck supple.     Comments: Positive Phalen's  Skin:  General: Skin is warm and dry.  Neurological:     Mental Status: He is alert and oriented to person, place, and time.     Comments: +phalens right  Psychiatric:        Behavior: Behavior normal.        Thought Content: Thought content normal.        Judgment: Judgment normal.     Assessment/Plan: Health Maintenance Due  Topic Date Due  . PNA vac Low Risk Adult (2 of 2 - PPSV23) 10/12/2019   Health Maintenance reviewed - up to date with preventative health care.  1. Preventative health care Keep up with healthy eating and regular exercise. - Pneumococcal polysaccharide vaccine 23-valent greater than or equal to 2yo subcutaneous/IM  2. Hypertension, unspecified type Well controlled. Continue with losartan 50mg  daily. - CBC with Differential/Platelet; Future - Comprehensive metabolic panel; Future  3. Hypothyroidism, unspecified type Will recheck thyroid function today. Continue with synthroid 58mcg daily if well controlled. Will send refill pending results. - TSH; Future  4. Low testosterone in male Follows regularly with urology.  Patient states that he had lab work done with them at recent visit and was told to return in 1 year.  5. HYPERTRIGLYCERIDEMIA Patient has tolerated Zocor 20 mg well.  We will recheck blood work today. - Lipid panel; Future  6. Carpal tunnel syndrome on right Advised to wear right wrist splint at night.  Let me know if this does not correct  symptoms or if worsening of symptoms.   Return in about 6 months (around 01/28/2021) for Chronic condition visit.  Micheline Rough, MD

## 2020-07-31 ENCOUNTER — Other Ambulatory Visit: Payer: Self-pay | Admitting: Family Medicine

## 2020-07-31 DIAGNOSIS — E039 Hypothyroidism, unspecified: Secondary | ICD-10-CM

## 2020-07-31 LAB — CBC WITH DIFFERENTIAL/PLATELET
Absolute Monocytes: 495 cells/uL (ref 200–950)
Basophils Absolute: 50 cells/uL (ref 0–200)
Basophils Relative: 1.1 %
Eosinophils Absolute: 99 cells/uL (ref 15–500)
Eosinophils Relative: 2.2 %
HCT: 42.8 % (ref 38.5–50.0)
Hemoglobin: 14.6 g/dL (ref 13.2–17.1)
Lymphs Abs: 896 cells/uL (ref 850–3900)
MCH: 31 pg (ref 27.0–33.0)
MCHC: 34.1 g/dL (ref 32.0–36.0)
MCV: 90.9 fL (ref 80.0–100.0)
MPV: 9.2 fL (ref 7.5–12.5)
Monocytes Relative: 11 %
Neutro Abs: 2961 cells/uL (ref 1500–7800)
Neutrophils Relative %: 65.8 %
Platelets: 377 10*3/uL (ref 140–400)
RBC: 4.71 10*6/uL (ref 4.20–5.80)
RDW: 12.8 % (ref 11.0–15.0)
Total Lymphocyte: 19.9 %
WBC: 4.5 10*3/uL (ref 3.8–10.8)

## 2020-07-31 LAB — COMPREHENSIVE METABOLIC PANEL
AG Ratio: 1.7 (calc) (ref 1.0–2.5)
ALT: 16 U/L (ref 9–46)
AST: 20 U/L (ref 10–35)
Albumin: 4.4 g/dL (ref 3.6–5.1)
Alkaline phosphatase (APISO): 66 U/L (ref 35–144)
BUN: 19 mg/dL (ref 7–25)
CO2: 26 mmol/L (ref 20–32)
Calcium: 9.5 mg/dL (ref 8.6–10.3)
Chloride: 105 mmol/L (ref 98–110)
Creat: 1.05 mg/dL (ref 0.70–1.25)
Globulin: 2.6 g/dL (calc) (ref 1.9–3.7)
Glucose, Bld: 86 mg/dL (ref 65–99)
Potassium: 4.9 mmol/L (ref 3.5–5.3)
Sodium: 139 mmol/L (ref 135–146)
Total Bilirubin: 0.7 mg/dL (ref 0.2–1.2)
Total Protein: 7 g/dL (ref 6.1–8.1)

## 2020-07-31 LAB — LIPID PANEL
Cholesterol: 138 mg/dL (ref <200)
HDL: 44 mg/dL (ref >=40)
LDL Cholesterol (Calc): 75 mg/dL (calc)
Non-HDL Cholesterol (Calc): 94 mg/dL (calc) (ref <130)
Total CHOL/HDL Ratio: 3.1 (calc) (ref <5.0)
Triglycerides: 100 mg/dL (ref <150)

## 2020-07-31 LAB — TSH: TSH: 1.01 mIU/L (ref 0.40–4.50)

## 2020-07-31 MED ORDER — SIMVASTATIN 20 MG PO TABS
20.0000 mg | ORAL_TABLET | Freq: Every day | ORAL | 3 refills | Status: DC
Start: 2020-07-31 — End: 2021-03-02

## 2020-07-31 MED ORDER — LEVOTHYROXINE SODIUM 50 MCG PO TABS: 50.0000 ug | ORAL_TABLET | Freq: Every day | ORAL | 3 refills | Status: DC

## 2020-11-12 ENCOUNTER — Encounter: Payer: Self-pay | Admitting: Family Medicine

## 2020-12-29 ENCOUNTER — Other Ambulatory Visit: Payer: Self-pay

## 2020-12-29 ENCOUNTER — Ambulatory Visit (INDEPENDENT_AMBULATORY_CARE_PROVIDER_SITE_OTHER): Payer: Medicare PPO

## 2020-12-29 DIAGNOSIS — Z0000 Encounter for general adult medical examination without abnormal findings: Secondary | ICD-10-CM | POA: Diagnosis not present

## 2020-12-29 NOTE — Patient Instructions (Addendum)
Jerry [name] , Thank you for taking time to come for your Medicare Wellness Visit. I appreciate your ongoing commitment to your health goals. Please review the following plan we discussed and let me know if I can assist you in the future.   Screening recommendations/referrals: Colonoscopy: Done 07/01/15 Recommended yearly ophthalmology/optometry visit for glaucoma screening and checkup Recommended yearly dental visit for hygiene and checkup  Vaccinations: Influenza vaccine:Up to date Pneumococcal vaccine: Up to date Tdap vaccine: Up to date Shingles vaccine: Completed 02/20/17 & 03/20/17   Covid-19: Done 08/24/20  Advanced directives: Advance directive discussed with you today. I have provided a copy for you to complete at home and have notarized. Once this is complete please bring a copy in to our office so we can scan it into your chart.  Conditions/risks identified: Continue working in gym and gain 10 LBS  Next appointment: Follow up in one year for your annual wellness visit.   Preventive Care 69 Years and Older, Male Preventive care refers to lifestyle choices and visits with your health care provider that can promote health and wellness. What does preventive care include?  A yearly physical exam. This is also called an annual well check.  Dental exams once or twice a year.  Routine eye exams. Ask your health care provider how often you should have your eyes checked.  Personal lifestyle choices, including:  Daily care of your teeth and gums.  Regular physical activity.  Eating a healthy diet.  Avoiding tobacco and drug use.  Limiting alcohol use.  Practicing safe sex.  Taking low doses of aspirin every day.  Taking vitamin and mineral supplements as recommended by your health care provider. What happens during an annual well check? The services and screenings done by your health care provider during your annual well check will depend on your age, overall health,  lifestyle risk factors, and family history of disease. Counseling  Your health care provider may ask you questions about your:  Alcohol use.  Tobacco use.  Drug use.  Emotional well-being.  Home and relationship well-being.  Sexual activity.  Eating habits.  History of falls.  Memory and ability to understand (cognition).  Work and work Statistician. Screening  You may have the following tests or measurements:  Height, weight, and BMI.  Blood pressure.  Lipid and cholesterol levels. These may be checked every 5 years, or more frequently if you are over 51 years old.  Skin check.  Lung cancer screening. You may have this screening every year starting at age 69 if you have a 30-pack-year history of smoking and currently smoke or have quit within the past 15 years.  Fecal occult blood test (FOBT) of the stool. You may have this test every year starting at age 69.  Flexible sigmoidoscopy or colonoscopy. You may have a sigmoidoscopy every 5 years or a colonoscopy every 10 years starting at age 51.  Prostate cancer screening. Recommendations will vary depending on your family history and other risks.  Hepatitis C blood test.  Hepatitis B blood test.  Sexually transmitted disease (STD) testing.  Diabetes screening. This is done by checking your blood sugar (glucose) after you have not eaten for a while (fasting). You may have this done every 1-3 years.  Abdominal aortic aneurysm (AAA) screening. You may need this if you are a current or former smoker.  Osteoporosis. You may be screened starting at age 69 if you are at high risk. Talk with your health care provider about your  test results, treatment options, and if necessary, the need for more tests. Vaccines  Your health care provider may recommend certain vaccines, such as:  Influenza vaccine. This is recommended every year.  Tetanus, diphtheria, and acellular pertussis (Tdap, Td) vaccine. You may need a Td booster  every 10 years.  Zoster vaccine. You may need this after age 80.  Pneumococcal 13-valent conjugate (PCV13) vaccine. One dose is recommended after age 24.  Pneumococcal polysaccharide (PPSV23) vaccine. One dose is recommended after age 13. Talk to your health care provider about which screenings and vaccines you need and how often you need them. This information is not intended to replace advice given to you by your health care provider. Make sure you discuss any questions you have with your health care provider. Document Released: 10/29/2015 Document Revised: 06/21/2016 Document Reviewed: 08/03/2015 Elsevier Interactive Patient Education  2017 [employer] Prevention in the Home Falls can cause injuries. They can happen to people of all ages. There are many things you can do to make your home safe and to help prevent falls. What can I do on the outside of my home?  Regularly fix the edges of walkways and driveways and fix any cracks.  Remove anything that might make you trip as you walk through a door, such as a raised step or threshold.  Trim any bushes or trees on the path to your home.  Use bright outdoor lighting.  Clear any walking paths of anything that might make someone trip, such as rocks or tools.  Regularly check to see if handrails are loose or broken. Make sure that both sides of any steps have handrails.  Any raised decks and porches should have guardrails on the edges.  Have any leaves, snow, or ice cleared regularly.  Use sand or salt on walking paths during winter.  Clean up any spills in your garage right away. This includes oil or grease spills. What can I do in the bathroom?  Use night lights.  Install grab bars by the toilet and in the tub and shower. Do not use towel bars as grab bars.  Use non-skid mats or decals in the tub or shower.  If you need to sit down in the shower, use a plastic, non-slip stool.  Keep the floor dry. Clean up any  water that spills on the floor as soon as it happens.  Remove soap buildup in the tub or shower regularly.  Attach bath mats securely with double-sided non-slip rug tape.  Do not have throw rugs and other things on the floor that can make you trip. What can I do in the bedroom?  Use night lights.  Make sure that you have a light by your bed that is easy to reach.  Do not use any sheets or blankets that are too big for your bed. They should not hang down onto the floor.  Have a firm chair that has side arms. You can use this for support while you get dressed.  Do not have throw rugs and other things on the floor that can make you trip. What can I do in the kitchen?  Clean up any spills right away.  Avoid walking on wet floors.  Keep items that you use a lot in easy-to-reach places.  If you need to reach something above you, use a strong step stool that has a grab bar.  Keep electrical cords out of the way.  Do not use floor polish or wax that  makes floors slippery. If you must use wax, use non-skid floor wax.  Do not have throw rugs and other things on the floor that can make you trip. What can I do with my stairs?  Do not leave any items on the stairs.  Make sure that there are handrails on both sides of the stairs and use them. Fix handrails that are broken or loose. Make sure that handrails are as long as the stairways.  Check any carpeting to make sure that it is firmly attached to the stairs. Fix any carpet that is loose or worn.  Avoid having throw rugs at the top or bottom of the stairs. If you do have throw rugs, attach them to the floor with carpet tape.  Make sure that you have a light switch at the top of the stairs and the bottom of the stairs. If you do not have them, ask someone to add them for you. What else can I do to help prevent falls?  Wear shoes that:  Do not have high heels.  Have rubber bottoms.  Are comfortable and fit you well.  Are closed  at the toe. Do not wear sandals.  If you use a stepladder:  Make sure that it is fully opened. Do not climb a closed stepladder.  Make sure that both sides of the stepladder are locked into place.  Ask someone to hold it for you, if possible.  Clearly mark and make sure that you can see:  Any grab bars or handrails.  First and last steps.  Where the edge of each step is.  Use tools that help you move around (mobility aids) if they are needed. These include:  Canes.  Walkers.  Scooters.  Crutches.  Turn on the lights when you go into a dark area. Replace any light bulbs as soon as they burn out.  Set up your furniture so you have a clear path. Avoid moving your furniture around.  If any of your floors are uneven, fix them.  If there are any pets around you, be aware of where they are.  Review your medicines with your doctor. Some medicines can make you feel dizzy. This can increase your chance of falling. Ask your doctor what other things that you can do to help prevent falls. This information is not intended to replace advice given to you by your health care provider. Make sure you discuss any questions you have with your health care provider. Document Released: 07/29/2009 Document Revised: 03/09/2016 Document Reviewed: 11/06/2014 Elsevier Interactive Patient Education  2017 [employer].

## 2020-12-29 NOTE — Progress Notes (Addendum)
Virtual Visit via Telephone Note  I connected with  Jerry [name] on 12/29/20 at 10:15 AM EDT by telephone and verified that I am speaking with the correct person using two identifiers.  Location: Patient: Home Provider: Office Persons participating in the virtual visit: patient/Nurse Health Advisor   I discussed the limitations, risks, security and privacy concerns of performing an evaluation and management service by telephone and the availability of in person appointments. The patient expressed understanding and agreed to proceed.  Interactive audio and video telecommunications were attempted between this nurse and patient, however failed, due to patient having technical difficulties OR patient did not have access to video capability.  We continued and completed visit with audio only.  Some vital signs may be absent or patient reported.   Willette Brace, LPN    Subjective:   Jerry [name] is a 70 y.o. male who presents for an Initial Medicare Annual Wellness Visit.  Review of Systems     Cardiac Risk Factors include: advanced age (>50men, >98 women);male gender;hypertension     Objective:    There were no vitals filed for this visit. There is no height or weight on file to calculate BMI.  Advanced Directives 12/29/2020 12/07/2016  Does Patient Have a Medical Advance Directive? No No  Would patient like information on creating a medical advance directive? Yes (MAU/Ambulatory/Procedural Areas - Information given) No - Patient declined    Current Medications (verified) Outpatient Encounter Medications as of 12/29/2020  Medication Sig  . aspirin 81 MG tablet Take 4 tablets (325 mg total) by mouth daily. (Patient taking differently: Take 81 mg by mouth daily.)  . AXIRON 30 MG/ACT SOLN Place 1 application onto the skin daily.   Marland Kitchen doxycycline (DORYX) 100 MG EC tablet Take 100 mg by mouth daily.  . fluticasone (FLONASE) 50 MCG/ACT nasal spray Place 2 sprays into both nostrils  daily as needed for rhinitis.  Marland Kitchen levothyroxine (SYNTHROID) 50 MCG tablet Take 1 tablet (50 mcg total) by mouth daily.  Marland Kitchen loratadine (CLARITIN) 10 MG tablet Take 10 mg by mouth daily as needed for allergies.  Marland Kitchen losartan (COZAAR) 50 MG tablet Take 1 tablet (50 mg total) by mouth daily.  . Multiple Vitamin (MULTIVITAMIN WITH MINERALS) TABS tablet Take 1 tablet by mouth daily.  . simvastatin (ZOCOR) 20 MG tablet Take 1 tablet (20 mg total) by mouth daily at 6 PM.  . tadalafil (CIALIS) 20 MG tablet Take 10-20 mg by mouth daily as needed for erectile dysfunction.  Marland Kitchen tretinoin (RETIN-A) 0.98 % cream 1 APPLICATION THIN LAYER TO FACE AT BEDTIME   No facility-administered encounter medications on file as of 12/29/2020.    Allergies (verified) Patient has no known allergies.   History: Past Medical History:  Diagnosis Date  . Allergy   . Basal cell carcinoma (BCC) of face 08/19/2019   removed by skin surgery center Dr. Pearline Cables  . Hyperlipidemia   . Hypertension   . Hypogonadism male   . TIA (transient ischemic attack) 11/2016   Past Surgical History:  Procedure Laterality Date  . CHOLECYSTECTOMY  2007   Family History  Problem Relation Age of Onset  . Diabetes Mother   . Heart attack Father 79  . Diabetes Sister   . Alcohol abuse Brother   . Lupus Sister   . Colon cancer Neg Hx    Social History   Socioeconomic History  . Marital status: Married    Spouse name: Not on file  . Number  of children: 1  . Years of education: 2  . Highest education level: Not on file  Occupational History  . Not on file  Tobacco Use  . Smoking status: Former Research scientist (life sciences)  . Smokeless tobacco: Never Used  . Tobacco comment: Quit in 1972  Substance and Sexual Activity  . Alcohol use: Yes    Comment: occ, beer and wine  . Drug use: No  . Sexual activity: Not on file  Other Topics Concern  . Not on file  Social History Narrative   Lives at home alone.  Works for [employer].  12 th grade  education.  Drinks caffeine (coffee) daily.    Social Determinants of Health   Financial Resource Strain: Low Risk   . Difficulty of Paying Living Expenses: Not hard at all  Food Insecurity: No Food Insecurity  . Worried About Charity fundraiser in the Last Year: Never true  . Ran Out of Food in the Last Year: Never true  Transportation Needs: No Transportation Needs  . Lack of Transportation (Medical): No  . Lack of Transportation (Non-Medical): No  Physical Activity: Sufficiently Active  . Days of Exercise per Week: 5 days  . Minutes of Exercise per Session: 90 min  Stress: No Stress Concern Present  . Feeling of Stress : Not at all  Social Connections: Moderately Isolated  . Frequency of Communication with Friends and Family: More than three times a week  . Frequency of Social Gatherings with Friends and Family: More than three times a week  . Attends Religious Services: Never  . Active Member of Clubs or Organizations: Yes  . Attends Archivist Meetings: 1 to 4 times per year  . Marital Status: Divorced    Tobacco Counseling Counseling given: Not Answered Comment: Quit in 1972   Clinical Intake:  Pre-visit preparation completed: Yes  Pain : No/denies pain     BMI - recorded: 24.33 Nutritional Status: BMI of 19-24  Normal Nutritional Risks: None Diabetes: No  How often do you need to have someone help you when you read instructions, pamphlets, or other written materials from your doctor or pharmacy?: 1 - Never  Diabetic?No  Interpreter Needed?: No  Information entered by :: Charlott Rakes, LPN   Activities of Daily Living In your present state of health, do you have any difficulty performing the following activities: 12/29/2020  Hearing? N  Vision? N  Difficulty concentrating or making decisions? N  Walking or climbing stairs? N  Dressing or bathing? N  Doing errands, shopping? N  Preparing Food and eating ? N  Using the Toilet? N  In the  past six months, have you accidently leaked urine? N  Do you have problems with loss of bowel control? N  Managing your Medications? N  Managing your Finances? N  Housekeeping or managing your Housekeeping? N  Some recent data might be hidden    Patient Care Team: Caren Macadam, MD as PCP - General (Family Medicine) Franchot Gallo, MD as Consulting Physician (Urology) Druscilla Brownie, MD as Referring Physician (Dermatology)  Indicate any recent Medical Services you may have received from other than [facility] providers in the past year (date may be approximate).     Assessment:   This is a routine wellness examination for Jerry [name].  Hearing/Vision screen  Hearing Screening   125Hz  250Hz  500Hz  1000Hz  2000Hz  3000Hz  4000Hz  6000Hz  8000Hz   Right ear:           Left ear:  Comments: Pt denies any hearing issues  Vision Screening Comments: Pt follows up with Dr crotch for annual eye exams   Dietary issues and exercise activities discussed: Current Exercise Habits: Home exercise routine, Type of exercise: Other - see comments (gym), Time (Minutes): > 60, Frequency (Times/Week): 5, Weekly Exercise (Minutes/Week): 0  Goals    . Patient Stated     Going to gym and trying to gain 10lbs       Depression Screen PHQ 2/9 Scores 12/29/2020 07/30/2020 06/25/2018  PHQ - 2 Score 0 0 0    Fall Risk Fall Risk  12/29/2020 07/30/2020 06/25/2018  Falls in the past year? 0 0 No  Number falls in past yr: 0 0 -  Injury with Fall? 0 - -  Risk for fall due to : Impaired vision - -  Follow up Falls prevention discussed - -    FALL RISK PREVENTION PERTAINING TO THE HOME:  Any stairs in or around the home? No  If so, are there any without handrails? No  Home free of loose throw rugs in walkways, pet beds, electrical cords, etc? Yes  Adequate lighting in your home to reduce risk of falls? Yes   ASSISTIVE DEVICES UTILIZED TO PREVENT FALLS:  Life alert? No  Use of a cane, walker or  w/c? No  Grab bars in the bathroom? No  Shower chair or bench in shower? No  Elevated toilet seat or a handicapped toilet? No   TIMED UP AND GO:  Was the test performed? No .     Cognitive Function:     6CIT Screen 12/29/2020  What Year? 0 points  What month? 0 points  Count back from 20 0 points  Months in reverse 0 points  Repeat phrase 0 points    Immunizations Immunization History  Administered Date(s) Administered  . Fluad Quad(high Dose 65+) 07/30/2020  . Influenza, High Dose Seasonal PF 10/11/2018  . Influenza,inj,Quad PF,6+ Mos 06/22/2015  . Janssen (J&J) SARS-COV-2 Vaccination 08/24/2020  . Pneumococcal Conjugate-13 10/11/2018  . Pneumococcal Polysaccharide-23 07/30/2020  . Td 10/16/2004  . Tdap 02/15/2015  . Zoster 03/16/2016  . Zoster Recombinat (Shingrix) 02/20/2017, 03/20/2017    TDAP status: Up to date  Flu Vaccine status: Up to date  Pneumococcal vaccine status: Up to date  Covid-19 vaccine status: Completed vaccines  Qualifies for Shingles Vaccine? Yes   Zostavax completed Yes   Shingrix Completed?: Yes  Screening Tests Health Maintenance  Topic Date Due  . COVID-19 Vaccine (2 - Booster for [employer] series) 10/19/2020  . TETANUS/TDAP  02/14/2025  . COLONOSCOPY (Pts 45-48yrs Insurance coverage will need to be confirmed)  06/30/2025  . INFLUENZA VACCINE  Completed  . Hepatitis C Screening  Completed  . PNA vac Low Risk Adult  Completed  . HPV VACCINES  Aged Out    Health Maintenance  Health Maintenance Due  Topic Date Due  . COVID-19 Vaccine (2 - Booster for Janssen series) 10/19/2020    Colorectal cancer screening: Type of screening: Colonoscopy. Completed 07/01/15. Repeat every 10 years  Additional Screening:  Hepatitis C Screening: Completed 05/15/19  Vision Screening: Recommended annual ophthalmology exams for early detection of glaucoma and other disorders of the eye. Is the patient up to date with their annual eye exam?  Yes   Who is the provider or what is the name of the office in which the patient attends annual eye exams? Dr Hector Shade If pt is not established with a provider, would they like to  be referred to a provider to establish care? No .    Dental Screening: Recommended annual dental exams for proper oral hygiene  Community Resource Referral / Chronic Care Management: CRR required this visit?  No   CCM required this visit?  No      Plan:     I have personally reviewed and noted the following in the patient's chart:   . Medical and social history . Use of alcohol, tobacco or illicit drugs  . Current medications and supplements . Functional ability and status . Nutritional status . Physical activity . Advanced directives . List of other physicians . Hospitalizations, surgeries, and ER visits in previous 12 months . Vitals . Screenings to include cognitive, depression, and falls . Referrals and appointments  In addition, I have reviewed and discussed with patient certain preventive protocols, quality metrics, and best practice recommendations. A written personalized care plan for preventive services as well as general preventive health recommendations were provided to patient.     Willette Brace, LPN   01/05/5671   Nurse Notes: Pt complained of occasional pain in left leg down calf to ankle , no swelling noted stated he didn't want earlier appt would come in on 01/26/21.

## 2021-01-26 ENCOUNTER — Ambulatory Visit: Payer: Medicare PPO | Admitting: Family Medicine

## 2021-01-28 ENCOUNTER — Ambulatory Visit: Payer: Medicare PPO | Admitting: Family Medicine

## 2021-02-16 ENCOUNTER — Telehealth: Payer: Self-pay | Admitting: Family Medicine

## 2021-02-16 NOTE — Telephone Encounter (Signed)
Patient would like the 7:45 appointment for 05/18

## 2021-02-16 NOTE — Telephone Encounter (Signed)
He could be added in at 3:30 or 7:45 - please just see what works best for whomever is working with me that day as well as patient.

## 2021-02-16 NOTE — Telephone Encounter (Signed)
The patient lives in [place] in [place] and wants to know if he can be worked in on 05/18 since he will be in [place] for another appointment.

## 2021-03-01 ENCOUNTER — Other Ambulatory Visit: Payer: Self-pay

## 2021-03-02 ENCOUNTER — Other Ambulatory Visit: Payer: Self-pay | Admitting: Family Medicine

## 2021-03-02 ENCOUNTER — Encounter: Payer: Self-pay | Admitting: Family Medicine

## 2021-03-02 ENCOUNTER — Ambulatory Visit: Payer: Medicare PPO | Admitting: Family Medicine

## 2021-03-02 VITALS — BP 98/62 | HR 70 | Temp 98.3°F | Ht 70.5 in | Wt 171.0 lb

## 2021-03-02 DIAGNOSIS — E781 Pure hyperglyceridemia: Secondary | ICD-10-CM

## 2021-03-02 DIAGNOSIS — R7989 Other specified abnormal findings of blood chemistry: Secondary | ICD-10-CM

## 2021-03-02 DIAGNOSIS — J301 Allergic rhinitis due to pollen: Secondary | ICD-10-CM | POA: Diagnosis not present

## 2021-03-02 DIAGNOSIS — E039 Hypothyroidism, unspecified: Secondary | ICD-10-CM | POA: Diagnosis not present

## 2021-03-02 DIAGNOSIS — I10 Essential (primary) hypertension: Secondary | ICD-10-CM

## 2021-03-02 LAB — COMPREHENSIVE METABOLIC PANEL
ALT: 17 U/L (ref 0–53)
AST: 23 U/L (ref 0–37)
Albumin: 4.3 g/dL (ref 3.5–5.2)
Alkaline Phosphatase: 62 U/L (ref 39–117)
BUN: 18 mg/dL (ref 6–23)
CO2: 27 mEq/L (ref 19–32)
Calcium: 9.1 mg/dL (ref 8.4–10.5)
Chloride: 104 mEq/L (ref 96–112)
Creatinine, Ser: 1.09 mg/dL (ref 0.40–1.50)
GFR: 69.74 mL/min (ref >60.00)
Glucose, Bld: 87 mg/dL (ref 70–99)
Potassium: 4.4 mEq/L (ref 3.5–5.1)
Sodium: 140 mEq/L (ref 135–145)
Total Bilirubin: 0.4 mg/dL (ref 0.2–1.2)
Total Protein: 6.7 g/dL (ref 6.0–8.3)

## 2021-03-02 LAB — TSH: TSH: 1.25 u[IU]/mL (ref 0.35–4.50)

## 2021-03-02 LAB — CBC WITH DIFFERENTIAL/PLATELET
Basophils Absolute: 0 10*3/uL (ref 0.0–0.1)
Basophils Relative: 1.2 % (ref 0.0–3.0)
Eosinophils Absolute: 0.1 10*3/uL (ref 0.0–0.7)
Eosinophils Relative: 3.2 % (ref 0.0–5.0)
HCT: 38.1 % — ABNORMAL LOW (ref 39.0–52.0)
Hemoglobin: 13.4 g/dL (ref 13.0–17.0)
Lymphocytes Relative: 21.5 % (ref 12.0–46.0)
Lymphs Abs: 0.9 10*3/uL (ref 0.7–4.0)
MCHC: 35.2 g/dL (ref 30.0–36.0)
MCV: 88.7 fl (ref 78.0–100.0)
Monocytes Absolute: 0.4 10*3/uL (ref 0.1–1.0)
Monocytes Relative: 9.7 % (ref 3.0–12.0)
Neutro Abs: 2.7 10*3/uL (ref 1.4–7.7)
Neutrophils Relative %: 64.4 % (ref 43.0–77.0)
Platelets: 368 10*3/uL (ref 150.0–400.0)
RBC: 4.29 Mil/uL (ref 4.22–5.81)
RDW: 13.2 % (ref 11.5–15.5)
WBC: 4.2 10*3/uL (ref 4.0–10.5)

## 2021-03-02 LAB — LIPID PANEL
Cholesterol: 163 mg/dL (ref 0–200)
HDL: 38.7 mg/dL — ABNORMAL LOW (ref >39.00)
NonHDL: 124.37
Total CHOL/HDL Ratio: 4
Triglycerides: 249 mg/dL — ABNORMAL HIGH (ref 0.0–149.0)
VLDL: 49.8 mg/dL — ABNORMAL HIGH (ref 0.0–40.0)

## 2021-03-02 LAB — PSA: PSA: 2.73 ng/mL (ref 0.10–4.00)

## 2021-03-02 LAB — TESTOSTERONE: Testosterone: 1331.59 ng/dL — ABNORMAL HIGH (ref 300.00–890.00)

## 2021-03-02 LAB — LDL CHOLESTEROL, DIRECT: Direct LDL: 90 mg/dL

## 2021-03-02 MED ORDER — LOSARTAN POTASSIUM 50 MG PO TABS: 50.0000 mg | ORAL_TABLET | Freq: Every day | ORAL | 3 refills | Status: DC

## 2021-03-02 MED ORDER — LEVOTHYROXINE SODIUM 50 MCG PO TABS
50.0000 ug | ORAL_TABLET | Freq: Every day | ORAL | 3 refills | Status: DC
Start: 2021-03-02 — End: 2022-01-02

## 2021-03-02 MED ORDER — SIMVASTATIN 20 MG PO TABS: 20.0000 mg | ORAL_TABLET | Freq: Every day | ORAL | 3 refills | Status: DC

## 2021-03-02 NOTE — Progress Notes (Signed)
Kateri Plummer DOB: 07/17/1951 Encounter date: 03/02/2021  This is a 70 y.o. male who presents with Chief Complaint  Patient presents with  . Follow-up    History of present illness: No worries today.   HTN: not light headed or dizzy - takes medicine in the morning. Losartan 50mg .   Seasonal allergies: generally well controlled. But worse here than where he usuallys. Flonase, claritin.   HL: still on zocor; tolerates well.   Gets testosterone through [facility]. Sees them yearly.   No Known Allergies Current Meds  Medication Sig  . aspirin 81 MG tablet Take 4 tablets (325 mg total) by mouth daily. (Patient taking differently: Take 81 mg by mouth daily.)  . AXIRON 30 MG/ACT SOLN Place 1 application onto the skin daily.   . fluticasone (FLONASE) 50 MCG/ACT nasal spray Place 2 sprays into both nostrils daily as needed for rhinitis.  Marland Kitchen levothyroxine (SYNTHROID) 50 MCG tablet Take 1 tablet (50 mcg total) by mouth daily.  Marland Kitchen loratadine (CLARITIN) 10 MG tablet Take 10 mg by mouth daily as needed for allergies.  Marland Kitchen losartan (COZAAR) 50 MG tablet Take 1 tablet (50 mg total) by mouth daily.  . Multiple Vitamin (MULTIVITAMIN WITH MINERALS) TABS tablet Take 1 tablet by mouth daily.  . simvastatin (ZOCOR) 20 MG tablet Take 1 tablet (20 mg total) by mouth daily at 6 PM.  . tadalafil (CIALIS) 20 MG tablet Take 10-20 mg by mouth daily as needed for erectile dysfunction.  Marland Kitchen tretinoin (RETIN-A) 4.25 % cream 1 APPLICATION THIN LAYER TO FACE AT BEDTIME  . [DISCONTINUED] doxycycline (DORYX) 100 MG EC tablet Take 100 mg by mouth daily.    Review of Systems  Constitutional: Negative for chills, fatigue and fever.  Respiratory: Negative for cough, chest tightness, shortness of breath and wheezing.   Cardiovascular: Negative for chest pain, palpitations and leg swelling.    Objective:  BP 98/62 (BP Location: Left Arm, Patient Position: Sitting, Cuff Size: Normal)   Pulse 70   Temp 98.3 F  (36.8 C) (Oral)   Ht 5' 10.5" (1.791 m)   Wt 171 lb (77.6 kg)   SpO2 96%   BMI 24.19 kg/m   Weight: 171 lb (77.6 kg)   BP Readings from Last 3 Encounters:  03/02/21 98/62  07/30/20 110/80  12/11/18 120/62   Wt Readings from Last 3 Encounters:  03/02/21 171 lb (77.6 kg)  07/30/20 172 lb (78 kg)  12/11/18 186 lb 4.8 oz (84.5 kg)    Physical Exam Constitutional:      General: He is not in acute distress.    Appearance: He is well-developed.  Cardiovascular:     Rate and Rhythm: Normal rate and regular rhythm.     Heart sounds: Normal heart sounds. No murmur heard. No friction rub.  Pulmonary:     Effort: Pulmonary effort is normal. No respiratory distress.     Breath sounds: Normal breath sounds. No wheezing or rales.  Musculoskeletal:     Right lower leg: No edema.     Left lower leg: No edema.  Neurological:     Mental Status: He is alert and oriented to person, place, and time.  Psychiatric:        Behavior: Behavior normal.     Assessment/Plan  1. Hypertension, unspecified type Very well controlled; low today. Will have him cut back on losartan to half tab if continues to run low at home and decrease as tolerated. He will message me for medmanagement. -  CBC with Differential/Platelet; Future - Comprehensive metabolic panel; Future  2. Allergic rhinitis due to pollen, unspecified seasonality Well controlled with claritin and flonase.  3. Hypothyroidism, unspecified type Recheck today; has been stable on 64mcg synthroid. - TSH; Future  4. Low testosterone in male [facility]; due for visit. Will add to our bloodwork today. - PSA; Future - Testosterone; Future  5. HYPERTRIGLYCERIDEMIA Stable on zocor. Recheck today. - Lipid panel; Future   Return in about 6 months (around 09/02/2021) for physical exam.    Micheline Rough, MD

## 2021-03-02 NOTE — Addendum Note (Signed)
Addended by: Elmer Picker on: 03/02/2021 08:14 AM   Modules accepted: Orders

## 2021-03-02 NOTE — Patient Instructions (Signed)
*  monitor blood pressures at home - if regularly less than 110/70 then cut losartan in half. Keep me posted.

## 2021-03-16 ENCOUNTER — Ambulatory Visit: Payer: Medicare PPO | Admitting: Family Medicine

## 2021-08-29 ENCOUNTER — Encounter: Payer: Medicare PPO | Admitting: Family Medicine

## 2022-01-02 ENCOUNTER — Other Ambulatory Visit: Payer: Self-pay | Admitting: Family Medicine

## 2022-01-02 DIAGNOSIS — E039 Hypothyroidism, unspecified: Secondary | ICD-10-CM

## 2022-01-04 ENCOUNTER — Ambulatory Visit: Payer: Medicare PPO
